# Patient Record
Sex: Female | Born: 1955
Health system: Southern US, Community
[De-identification: ages and names within clinical notes are randomized; demographics above are authoritative.]

## PROBLEM LIST (undated history)

## (undated) DIAGNOSIS — K2100 Gastro-esophageal reflux disease with esophagitis, without bleeding: Secondary | ICD-10-CM

## (undated) DIAGNOSIS — K449 Diaphragmatic hernia without obstruction or gangrene: Secondary | ICD-10-CM

## (undated) DIAGNOSIS — K635 Polyp of colon: Secondary | ICD-10-CM

## (undated) DIAGNOSIS — E119 Type 2 diabetes mellitus without complications: Secondary | ICD-10-CM

## (undated) HISTORY — DX: Gastro-esophageal reflux disease with esophagitis, without bleeding: K21.00

## (undated) HISTORY — PX: ABDOMINAL HYSTERECTOMY: SUR658

## (undated) HISTORY — DX: Polyp of colon: K63.5

## (undated) HISTORY — DX: Gastro-esophageal reflux disease with esophagitis: K21.0

## (undated) HISTORY — DX: Diaphragmatic hernia without obstruction or gangrene: K44.9

---

## 2001-09-02 ENCOUNTER — Ambulatory Visit (HOSPITAL_COMMUNITY): Admission: RE | Admit: 2001-09-02 | Discharge: 2001-09-02 | Payer: Self-pay | Admitting: Unknown Physician Specialty

## 2001-09-02 ENCOUNTER — Encounter: Payer: Self-pay | Admitting: Unknown Physician Specialty

## 2001-09-05 ENCOUNTER — Ambulatory Visit (HOSPITAL_COMMUNITY): Admission: RE | Admit: 2001-09-05 | Discharge: 2001-09-05 | Payer: Self-pay | Admitting: Unknown Physician Specialty

## 2001-09-05 ENCOUNTER — Encounter: Payer: Self-pay | Admitting: Unknown Physician Specialty

## 2002-09-04 ENCOUNTER — Ambulatory Visit (HOSPITAL_COMMUNITY): Admission: RE | Admit: 2002-09-04 | Discharge: 2002-09-04 | Payer: Self-pay | Admitting: Unknown Physician Specialty

## 2002-09-04 ENCOUNTER — Encounter: Payer: Self-pay | Admitting: Unknown Physician Specialty

## 2003-09-08 ENCOUNTER — Ambulatory Visit (HOSPITAL_COMMUNITY): Admission: RE | Admit: 2003-09-08 | Discharge: 2003-09-08 | Payer: Self-pay | Admitting: Unknown Physician Specialty

## 2004-09-09 ENCOUNTER — Ambulatory Visit (HOSPITAL_COMMUNITY): Admission: RE | Admit: 2004-09-09 | Discharge: 2004-09-09 | Payer: Self-pay | Admitting: *Deleted

## 2005-03-29 ENCOUNTER — Inpatient Hospital Stay (HOSPITAL_COMMUNITY): Admission: RE | Admit: 2005-03-29 | Discharge: 2005-04-01 | Payer: Self-pay | Admitting: Obstetrics & Gynecology

## 2005-03-29 ENCOUNTER — Encounter: Payer: Self-pay | Admitting: Obstetrics & Gynecology

## 2005-09-11 ENCOUNTER — Ambulatory Visit (HOSPITAL_COMMUNITY): Admission: RE | Admit: 2005-09-11 | Discharge: 2005-09-11 | Payer: Self-pay | Admitting: Unknown Physician Specialty

## 2006-09-13 ENCOUNTER — Ambulatory Visit (HOSPITAL_COMMUNITY): Admission: RE | Admit: 2006-09-13 | Discharge: 2006-09-13 | Payer: Self-pay | Admitting: Unknown Physician Specialty

## 2007-07-29 ENCOUNTER — Encounter: Payer: Self-pay | Admitting: Internal Medicine

## 2007-07-29 ENCOUNTER — Ambulatory Visit: Payer: Self-pay | Admitting: Internal Medicine

## 2007-07-29 ENCOUNTER — Ambulatory Visit (HOSPITAL_COMMUNITY): Admission: RE | Admit: 2007-07-29 | Discharge: 2007-07-29 | Payer: Self-pay | Admitting: Internal Medicine

## 2007-07-29 HISTORY — PX: COLONOSCOPY: SHX174

## 2007-09-05 ENCOUNTER — Encounter (HOSPITAL_COMMUNITY): Admission: RE | Admit: 2007-09-05 | Discharge: 2007-10-05 | Payer: Self-pay | Admitting: Podiatry

## 2007-09-16 ENCOUNTER — Ambulatory Visit (HOSPITAL_COMMUNITY): Admission: RE | Admit: 2007-09-16 | Discharge: 2007-09-16 | Payer: Self-pay | Admitting: Internal Medicine

## 2008-09-16 ENCOUNTER — Ambulatory Visit (HOSPITAL_COMMUNITY): Admission: RE | Admit: 2008-09-16 | Discharge: 2008-09-16 | Payer: Self-pay | Admitting: Internal Medicine

## 2009-02-10 ENCOUNTER — Ambulatory Visit: Admission: RE | Admit: 2009-02-10 | Discharge: 2009-02-10 | Payer: Self-pay | Admitting: Internal Medicine

## 2009-09-17 ENCOUNTER — Ambulatory Visit (HOSPITAL_COMMUNITY): Admission: RE | Admit: 2009-09-17 | Discharge: 2009-09-17 | Payer: Self-pay | Admitting: Internal Medicine

## 2010-09-02 ENCOUNTER — Other Ambulatory Visit (HOSPITAL_COMMUNITY): Payer: Self-pay | Admitting: Internal Medicine

## 2010-09-02 DIAGNOSIS — Z1239 Encounter for other screening for malignant neoplasm of breast: Secondary | ICD-10-CM

## 2010-09-20 ENCOUNTER — Ambulatory Visit (HOSPITAL_COMMUNITY): Admission: RE | Admit: 2010-09-20 | Payer: Self-pay | Source: Home / Self Care | Admitting: Internal Medicine

## 2010-09-20 ENCOUNTER — Ambulatory Visit (HOSPITAL_COMMUNITY)
Admission: RE | Admit: 2010-09-20 | Discharge: 2010-09-20 | Disposition: A | Discharge status: Home / Self Care | Payer: 59 | Source: Ambulatory Visit | Attending: Internal Medicine | Admitting: Internal Medicine

## 2010-09-20 DIAGNOSIS — Z1231 Encounter for screening mammogram for malignant neoplasm of breast: Secondary | ICD-10-CM | POA: Insufficient documentation

## 2010-09-20 DIAGNOSIS — Z1239 Encounter for other screening for malignant neoplasm of breast: Secondary | ICD-10-CM

## 2010-11-16 ENCOUNTER — Other Ambulatory Visit (HOSPITAL_COMMUNITY): Payer: Self-pay | Admitting: Internal Medicine

## 2010-11-16 DIAGNOSIS — R1011 Right upper quadrant pain: Secondary | ICD-10-CM

## 2010-11-17 ENCOUNTER — Ambulatory Visit (HOSPITAL_COMMUNITY)
Admission: RE | Admit: 2010-11-17 | Discharge: 2010-11-17 | Disposition: A | Discharge status: Home / Self Care | Payer: 59 | Source: Ambulatory Visit | Attending: Internal Medicine | Admitting: Internal Medicine

## 2010-11-17 DIAGNOSIS — R1011 Right upper quadrant pain: Secondary | ICD-10-CM | POA: Insufficient documentation

## 2010-11-17 DIAGNOSIS — R110 Nausea: Secondary | ICD-10-CM | POA: Insufficient documentation

## 2010-12-27 NOTE — Op Note (Signed)
NAME:  Cheryl Gutierrez, Cheryl Gutierrez             ACCOUNT NO.:  [id number]   MEDICAL RECORD NO.:  [id number]          PATIENT TYPE:  AMB   LOCATION:  DAY                           FACILITY:  [facility]   PHYSICIAN:  R. Roetta Sessions, M.D. DATE OF BIRTH:  [birth year]   DATE OF PROCEDURE:  07/29/2007  DATE OF DISCHARGE:                               OPERATIVE REPORT   PROCEDURE:  Colonoscopy with snare polypectomy.   INDICATIONS FOR PROCEDURE:  Patient is a 55 year old lady sent over for  consideration for colorectal cancer screening.  She has never had her  lower GI tract imaged.  She is devoid of any lower GI tract symptoms.  There is no family history of colon cancer.  Colonoscopy is now being  done.  This approach has been discussed with the patient at length.  Potential risks, benefits and alternatives have been reviewed and  questions answered.  She is agreeable.  Please see documentation on the  medical record.   PROCEDURE NOTE:  O2 saturation, blood pressure, pulses, and respirations  were monitored throughout the entire procedure.  Conscious sedation with  Versed 5 mg IV and Demerol 100 mg IV in divided doses.   INSTRUMENT:  Pentax video chip system.   FINDINGS:  Digital rectal exam revealed no abnormalities.   ENDOSCOPIC FINDINGS:  Prep was adequate.  The colonic mucosa was  surveyed from the rectosigmoid junction through the left transverse,  right colon, to the appendiceal orifice, the ileocecal valve, and cecum.  These structures were well seen and photographed for the record.  From  this level, the scope was slowly and cautiously withdrawn, and all  previously mentioned mucosal surfaces were again seen.  In the mid  sigmoid colon, there was a 5 mm pedunculated polyp.  It was cold snared  and recovered through the scope.  The remainder of the colonic mucosa  appeared normal.  The scoped was pulled down to the rectum.  A thorough  examination of the rectal mucosa, including retroflexed view  of the anal  verge, demonstrated some anal papilla, otherwise rectal mucosa appeared  normal.  The patient tolerated the procedure well and was reactive.   ENDOSCOPY IMPRESSION:  1. Anal papillae, otherwise normal rectum.  2. Pedunculated polyp, mid sigmoid colon, removed with snare, as      described above.  The remainder of colonic mucosa appeared normal.   RECOMMENDATIONS:  1. Follow up on path.  2. Further recommendations to follow.      Jonathon Bellows, M.D.  Electronically Signed     RMR/MEDQ  D:  07/29/2007  T:  07/29/2007  Job:  [id number]   cc:   Kingsley Callander. Ouida Sills, MD  Fax: [phone]

## 2010-12-27 NOTE — Procedures (Signed)
NAME:  Cheryl Gutierrez, Cheryl Gutierrez             ACCOUNT NO.:  [id number]   MEDICAL RECORD NO.:  [id number]          PATIENT TYPE:  OUT   LOCATION:  SLEE                          FACILITY:  [facility]   PHYSICIAN:  Kofi A. Gerilyn Pilgrim, M.D. DATE OF BIRTH:  1956-03-09   DATE OF PROCEDURE:  02/10/2009  DATE OF DISCHARGE:  02/10/2009                             SLEEP DISORDER REPORT   REFERRING PHYSICIAN:  Kingsley Callander. Ouida Sills, MD   INDICATIONS:  A 53-year lady who presents with hypersomnia, snoring, and  is being evaluated for obstructive sleep apnea syndrome.   MEDICATIONS:  Simvastatin, Paxil, aspirin.   EPWORTH SLEEPINESS SCALE:  1. BMI 32.   ARCHITECTURAL SUMMARY:  This is a split night recording with the initial  part being a diagnostic and the second part a titration recording.  The  total recording time is 441 minutes.  The sleep efficiency is 73.5%.  Sleep latency 85 minutes.  REM latency 244.5 minutes.   RESPIRATORY SUMMARY:  The baseline oxygen saturation is 98 with the  lowest saturation 91.  The diagnostic AHI is 23.6.  The patient was  titrated between pressures of 5 and 8; however, the optimal pressure is  7 resulting in resolution of events and good tolerability.   LIMB MOVEMENTS SUMMARY:  The PLM index is 14.   ELECTROCARDIOGRAM SUMMARY:  Average heart rate of 77 with no arrhythmias  observed.   IMPRESSION:  1. Moderate obstructive sleep apnea syndrome which responded well to a      continuous positive airway pressure of 7.  2. 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      Kofi A. Gerilyn Pilgrim, M.D.  Electronically Signed     KAD/MEDQ  D:  02/16/2009  T:  02/16/2009  Job:  [id number]

## 2010-12-30 NOTE — Op Note (Signed)
NAME:  Cheryl Gutierrez, Cheryl Gutierrez NO.:  [id number]   MEDICAL RECORD NO.:  [id number]          PATIENT TYPE:  AMB   LOCATION:  DAY                           FACILITY:  [facility]   PHYSICIAN:  Lazaro Arms, M.D.   DATE OF BIRTH:  [birth year]   DATE OF PROCEDURE:  03/29/2005  DATE OF DISCHARGE:                                 OPERATIVE REPORT   PREOPERATIVE DIAGNOSES:  1.  Enlarged fibroid uterus.  2.  Menometrorrhagia.   POSTOPERATIVE DIAGNOSES:  1.  Enlarged fibroid uterus.  2.  Menometrorrhagia.   OPERATION PERFORMED:  Total abdominal hysterectomy, bilateral salpingo-  oophorectomy.   SURGEON:  Lazaro Arms, M.D.   ANESTHESIA:  General endotracheal.   FINDINGS:  The patient had multiple fibroids in her uterus, probably 8 to 10  week size.  She also had a left paratubal cyst that was benign.  Her ovaries  were normal.  Appendix was normal.  The rest of the intra-abdominal  peritoneal cavity was normal.   DESCRIPTION OF PROCEDURE:  The patient was taken to the operating room and  placed in supine position where she underwent general endotracheal  anesthesia. A Foley catheter was placed.  The vagina was prepped and the  abdomen was prepped and draped in the usual sterile fashion.  A Pfannenstiel  skin incision was made and carried down sharply to the rectus fascia which  was scored in the midline and extended laterally.  The fascia was taken off  the muscles superiorly and inferiorly without difficulty.  The muscles were  divided.  The peritoneal cavity was entered.  Balfour self-retaining  retractor was placed and the upper abdomen was packed away.  The uterine  cornua were grasped.  The left round ligament was suture ligated and cut.  The infundibulopelvic ligament was isolated, clamped, cut and double suture  ligated.  The vesicouterine serosal flap on the left was created.  The right  round ligament was suture ligated and cut.  The infundibulopelvic ligament  on  the right was clamped, cut and doubly suture ligated.  The vesicouterine  serosa was clamped on the right, was created and the uterine vessels  skeletonized bilaterally.  The uterine vessels were clamped, cut and suture  ligated.  Serial pedicles were taken down the cervix through the cardinal  ligament, each pedicle being clamped, cut and suture ligated.  Vaginal angle  was encountered and sutured.  The specimen was removed and the vagina was  closed with 0 Vicryl suture.  The pelvis was irrigated vigorously.  All  pedicles were found to be hemostatic.  Interceed was placed over the vaginal  cuff to prevent postoperative adhesion formation.  All laps were removed and  the Balfour self-retaining retractor was removed.  The muscle was  reapproximated loosely, the fascia closed using 0 Vicryl running,  subcutaneous tissue was made  hemostatic and irrigated. The skin was closed using skin staples.  The  patient tolerated the procedure well.  She experienced 125 mL blood loss and  was taken to the recovery room in good stable condition, all counts correct  times  three.      Lazaro Arms, M.D.  Electronically Signed     LHE/MEDQ  D:  03/29/2005  T:  03/29/2005  Job:  [id number]

## 2010-12-30 NOTE — Discharge Summary (Signed)
NAME:  Cheryl Gutierrez, Cheryl Gutierrez                       ACCOUNT NO.:  [id number]   MEDICAL RECORD NO.:  [id number]                   PATIENT TYPE:  OUT   LOCATION:  RAD                                  FACILITY:  [facility]   PHYSICIAN:  Dirk Dress. Katrinka Blazing, M.D.                DATE OF BIRTH:  12-31-1955   DATE OF ADMISSION:  12/26/2003  DATE OF DISCHARGE:  01/02/2004                                 DISCHARGE SUMMARY   DISCHARGE DIAGNOSES:  1. Schizophrenia with acute psychotic episode.  2. Diabetes mellitus.  3. Hypertension.   DISPOSITION:  The patient is discharged home in stable and satisfactory  condition.   DISCHARGE MEDICATIONS:  1. __________ carbonate 300 mg two tablets q.h.s.  2. Zyprexa 30 mg q.h.s.  3. Lotrel 520 one daily.  4. Thiazide 37.5/25 daily.  5. Ultram 50 mg q.i.d.  6. Lortab 7.5 mg q.6h. p.r.n.  7. Atacan 32 mg daily.  8. Xanax 1 mg t.i.d.  9. [facility]   FOLLOWUP:  She is scheduled to be seen in the office Jan 04, 2004.   SUMMARY:  A 56 year old female admitted with acute and psychotic episode.  The patient has a long history of schizophrenia but has been relatively  stable for the last 10 years.  She has not had any acute exacerbations and  had not required hospitalizations.  The patient's states that she became  severely agitated on the day of admission.  She complained of hurting all  over, shortness of breath, she started shaking as if she was having  seizures.  She was transported to the emergency room by EMS.  In the  emergency room, she was found to have generalized shaking with tachypnea and  stiff neck.  Blood pressure was elevated, and she had difficulty talking.  She was treated with multiple doses of IV Ativan and gradually improved.  When I evaluated the patient, she was much improved and somnolent.  She no  longer had tachypnea.  It was felt that she needed to be admitted and have  behavior health evaluation.  On exam,  she was cooperative.  She had excess  makeup.  Blood pressure 170/100, pulse 130, respirations 28, temperature 98,  oxygen saturation 97%.  General physical examination was otherwise normal.  Urine drug screen was positive only for benzodiazepines.  Blood gas pH 7.56,  PCO2 28, PO2 79, bicarbonate 25.  White count 6.3, hemoglobin 13.9,  hematocrit 29.6.  Urinalysis was normal.  Chest x-ray as normal.  Metabolic  panel was normal.  The patient was admitted.  She was given IV Ativan p.r.n.  __________ consult was obtained, and it was the impression that she was  stable.  The patient's behavior was somewhat erratic, but she had periods of  increased anxiety.  EEG was done, and this ruled out seizure disorder.  She  had some mild shortness of breath with  some chronic wheezes.  She was  treated with steroids, and this resolved.  She also received Xopenex and  Atrovent  inhalers.  By May 21, she was stable.  She was not anxious.  She still had  occasions of hearing voices.  Vital signs were stable.  Lungs only revealed  a few rhonchi.  She was discharged home with plans to follow up by mental  health on May 22, to get IM Haldol.     ___________________________________________                                         Dirk Dress. Katrinka Blazing, M.D.   LCS/MEDQ  D:  02/07/2004  T:  02/08/2004  Job:  [id number]

## 2010-12-30 NOTE — Discharge Summary (Signed)
NAME:  JEFFRIESCordelia, Bessinger NO.:  [id number]   MEDICAL RECORD NO.:  [id number]          PATIENT TYPE:  INP   LOCATION:  A312                          FACILITY:  [facility]   PHYSICIAN:  Lazaro Arms, M.D.   DATE OF BIRTH:  [birth year]   DATE OF ADMISSION:  03/29/2005  DATE OF DISCHARGE:  08/19/2006LH                                 DISCHARGE SUMMARY   DISCHARGE DIAGNOSES:  1.  Status post total abdominal hysterectomy bilateral salpingo-      oophorectomy.  2.  Unremarkable postoperative course.   PROCEDURES:  TAH BSO.   Please refer to the transcribed History and Physical as well as the OP  report for details of admission to the hospital.   HOSPITAL COURSE:  The patient was admitted after surgery.  Her  intraoperative course was unremarkable.  She tolerated clear liquids and a  regular diet.  She voided without symptoms and was extensively ambulatory.  Her postop day #1, hemoglobin and hematocrit were 13 and 37.0, postop day #3  was 12.9 and 36.7.  This was basically stable with preoperative hemoglobin  of 14.4 and 41.4.  Her incision was clean dry and intact.  Her abdominal  exam was benign.  She began to have normal bowel function.  She was  discharged to home on the morning of postop day #3 in good stable condition.  Follow up in the office next week and have her staples removed.  She was  discharged to home on Motrin and Tylox.      Lazaro Arms, M.D.  Electronically Signed     LHE/MEDQ  D:  05/17/2005  T:  05/17/2005  Job:  [id number]

## 2010-12-30 NOTE — H&P (Signed)
NAME:  JEFFRIESDeryn, Mass[place]ale NO.:  [id number]   MEDICAL RECORD NO.:  [id number]          PATIENT TYPE:  AMB   LOCATION:  DAY                           FACILITY:  [facility]   PHYSICIAN:  Lazaro Arms, M.D.   DATE OF BIRTH:  December 08, 1955   DATE OF ADMISSION:  DATE OF DISCHARGE:  LH                                HISTORY & PHYSICAL   Cheryl Gutierrez is a 55 year old white female, gravida 0, para 0, last menstrual  period which began April 14 and merely has basically continued.  She has  suffered with prolonged vaginal bleeding, dysfunctional uterine bleeding now  for quite some time.   On examination, she has an enlarged uterus of 8 to 10-week size consistent  with fibroids, and as a result of that, we considered ablation, but she is  opting for definitive therapy with hysterectomy.  Of course with her age,  will also remove the ovaries.  Ultrasound confirms the presence of multiple  fibroids, and we will proceed with TAH-BSO.   PAST MEDICAL HISTORY:  Negative.   PAST SURGICAL HISTORY:  Negative.   PAST OBSTETRICAL HISTORY:  Nulliparous.   ALLERGIES:  None.   MEDICATIONS:  Paxil.   PHYSICAL EXAMINATION:  VITAL SIGNS:  Blood pressure 140/80, weight 191  pounds.  HEENT:  Unremarkable.  NECK: Thyroid is normal.  LUNGS:  Clear.  HEART: Regular rhythm without regurgitation or gallop.  BREASTS:  Without mass, discharge, skin changes.  ABDOMEN:  Benign.  No hepatosplenomegaly or masses.  PELVIC:  She has normal external genitalia.  Vagina moist without discharge.  Uterus enlarged at 8 to 10-week size.  Multiple fibroids.  Ovaries normal,  nontender.  EXTREMITIES:  Warm without edema.  NEUROLOGIC:  Exam grossly intact.   IMPRESSION:  1.  Enlarged fibroid uterus.  2.  Menometrorrhagia, dysfunctional bleeding.   PLAN:  The patient admitted for TAH-BSO.  She understands the risks,  benefits, indications, alternatives, and will proceed.      Lazaro Arms, M.D.  Electronically Signed     LHE/MEDQ  D:  03/28/2005  T:  03/28/2005  Job:  [id number]

## 2011-07-31 ENCOUNTER — Encounter: Payer: Self-pay | Admitting: Orthopedic Surgery

## 2011-07-31 ENCOUNTER — Ambulatory Visit (INDEPENDENT_AMBULATORY_CARE_PROVIDER_SITE_OTHER): Payer: 59 | Admitting: Orthopedic Surgery

## 2011-07-31 ENCOUNTER — Ambulatory Visit: Payer: 59 | Admitting: Orthopedic Surgery

## 2011-07-31 VITALS — BP 108/68 | Ht 64.0 in | Wt 176.0 lb

## 2011-07-31 DIAGNOSIS — M719 Bursopathy, unspecified: Secondary | ICD-10-CM

## 2011-07-31 DIAGNOSIS — M75102 Unspecified rotator cuff tear or rupture of left shoulder, not specified as traumatic: Secondary | ICD-10-CM | POA: Insufficient documentation

## 2011-07-31 MED ORDER — NABUMETONE 500 MG PO TABS: 500.0000 mg | ORAL_TABLET | Freq: Two times a day (BID) | ORAL | Status: AC

## 2011-07-31 NOTE — Progress Notes (Signed)
Patient ID: Cheryl Gutierrez, female   DOB: Oct 24, 1955, 55 y.o.   MRN: [id number] X-ray report  2 views LEFT shoulder  Normal glenohumeral joint, normal humerus, normal acromion.  Impression normal shoulder Subacromial Shoulder Injection Procedure Note  Pre-operative Diagnosis: left RC Syndrome  Post-operative Diagnosis: same  Indications: pain   Anesthesia: ethyl chloride   Procedure Details   Verbal consent was obtained for the procedure. The shoulder was prepped withalcohol and the skin was anesthetized. A 20 gauge needle was advanced into the subacromial space through posterior approach without difficulty  The space was then injected with 3 ml 1% lidocaine and 1 ml of depomedrol. The injection site was cleansed with isopropyl alcohol and a dressing was applied.  Complications:  None; patient tolerated the procedure well.  Subjective:    Cheryl Gutierrez is a 55 y.o. female who presents with left shoulder pain. The symptoms began several months ago. Aggravating factors: raising the arm and rolling onto the arm. Pain is located deltoid area and upper arm . Discomfort is described as sharp/stabbing. Symptoms are exacerbated by overhead movements and lying on the shoulder. Evaluation to date: none. Therapy to date includes: none.  The following portions of the patient's history were reviewed and updated as appropriate: allergies, current medications, past family history, past medical history, past social history, past surgical history and problem list.  Review of Systems A comprehensive review of systems was negative except for: Neurological: positive for numbness medial side right great toe   Objective:    BP 108/68  Ht 5\' 4"  (1.626 m)  Wt 176 lb (79.833 kg)  BMI 30.21 kg/m2 Right shoulder: normal active ROM, no tenderness, no impingement sign  Left shoulder: tenderness over the glenohumeral joint, distal neuromuscular exam negative, positive for tenderness about the  glenohumeral joint, negative for tenderness over the acromioclavicular joint, positive for impingement sign, sensory exam normal, motor exam normal and radial pulse intact     Assessment:    Left rotator cuff syndrome    Plan:    Educational material distributed. Reduction in offending activity. Gentle ROM exercises. NSAIDs per medication orders. Plain film x-rays. Shoulder injection. See procedure note. HEP [employer]ERABANDS

## 2011-07-31 NOTE — Patient Instructions (Addendum)
You have received a steroid shot. 15% of patients experience increased pain at the injection site with in the next 24 hours. This is best treated with ice and tylenol extra strength 2 tabs every 8 hours. If you are still having pain please call the office.   Start exercise program  [address]

## 2011-08-03 ENCOUNTER — Ambulatory Visit: Payer: 59 | Admitting: Orthopedic Surgery

## 2011-09-01 ENCOUNTER — Other Ambulatory Visit (HOSPITAL_COMMUNITY): Payer: Self-pay | Admitting: Internal Medicine

## 2011-09-01 DIAGNOSIS — Z139 Encounter for screening, unspecified: Secondary | ICD-10-CM

## 2011-09-25 ENCOUNTER — Ambulatory Visit (HOSPITAL_COMMUNITY)
Admission: RE | Admit: 2011-09-25 | Discharge: 2011-09-25 | Disposition: A | Discharge status: Home / Self Care | Payer: 59 | Source: Ambulatory Visit | Attending: Internal Medicine | Admitting: Internal Medicine

## 2011-09-25 DIAGNOSIS — Z139 Encounter for screening, unspecified: Secondary | ICD-10-CM

## 2011-09-25 DIAGNOSIS — Z1231 Encounter for screening mammogram for malignant neoplasm of breast: Secondary | ICD-10-CM | POA: Insufficient documentation

## 2012-09-09 ENCOUNTER — Other Ambulatory Visit (HOSPITAL_COMMUNITY): Payer: Self-pay | Admitting: Internal Medicine

## 2012-09-09 DIAGNOSIS — Z139 Encounter for screening, unspecified: Secondary | ICD-10-CM

## 2012-09-30 ENCOUNTER — Ambulatory Visit (HOSPITAL_COMMUNITY): Payer: 59

## 2012-09-30 ENCOUNTER — Ambulatory Visit (HOSPITAL_COMMUNITY)
Admission: RE | Admit: 2012-09-30 | Discharge: 2012-09-30 | Disposition: A | Discharge status: Home / Self Care | Payer: 59 | Source: Ambulatory Visit | Attending: Internal Medicine | Admitting: Internal Medicine

## 2012-09-30 DIAGNOSIS — Z139 Encounter for screening, unspecified: Secondary | ICD-10-CM

## 2012-09-30 DIAGNOSIS — Z1231 Encounter for screening mammogram for malignant neoplasm of breast: Secondary | ICD-10-CM | POA: Insufficient documentation

## 2013-02-04 ENCOUNTER — Other Ambulatory Visit (HOSPITAL_COMMUNITY): Payer: Self-pay | Admitting: Internal Medicine

## 2013-02-04 DIAGNOSIS — R1011 Right upper quadrant pain: Secondary | ICD-10-CM

## 2013-02-05 ENCOUNTER — Encounter (HOSPITAL_COMMUNITY): Payer: Self-pay

## 2013-02-05 ENCOUNTER — Ambulatory Visit (HOSPITAL_COMMUNITY)
Admission: RE | Admit: 2013-02-05 | Discharge: 2013-02-05 | Disposition: A | Discharge status: Home / Self Care | Payer: 59 | Source: Ambulatory Visit | Attending: Internal Medicine | Admitting: Internal Medicine

## 2013-02-05 DIAGNOSIS — R1011 Right upper quadrant pain: Secondary | ICD-10-CM

## 2013-02-05 HISTORY — DX: Type 2 diabetes mellitus without complications: E11.9

## 2013-02-05 MED ORDER — TECHNETIUM TC 99M MEBROFENIN IV KIT
5.0000 | PACK | Freq: Once | INTRAVENOUS | Status: AC | PRN
  Administered 2013-02-05: 5 via INTRAVENOUS

## 2013-03-06 ENCOUNTER — Telehealth: Payer: Self-pay | Admitting: Pulmonary Disease

## 2013-03-06 NOTE — Telephone Encounter (Signed)
I spoke with pt. She has never been seen in our office. She stated Dr Ouida Sills advised her since [facility] read her study he will need to be the one to increase her CPAP pressure if needed. I advised her whoever has been signing for her supplies is the one who needs to address this. I advised her since we have never seen her we can not do so. I advised her if she is needing to be seen/establish a sleep physician we can set up appt. She stated she will call Dr. Alonza Smoker office again to see if they will. If not she will call for appt. Nothing further was needed

## 2013-03-24 ENCOUNTER — Telehealth: Payer: Self-pay | Admitting: Internal Medicine

## 2013-03-24 NOTE — Telephone Encounter (Signed)
Pt is scheduled to see Dr. Jena Gauss on tomorrow at 11:30am

## 2013-03-24 NOTE — Telephone Encounter (Signed)
Patient called to make OV with RMR. She has been having abdominal pain and is concerned about it. I told her RMR's next available with be 9/17. She doesn't want to wait that far out. I offered to make her OV with an extender, but she said, "No, she wanted RMR". She said to wait and she would see what she could do.

## 2013-03-25 ENCOUNTER — Ambulatory Visit (INDEPENDENT_AMBULATORY_CARE_PROVIDER_SITE_OTHER): Payer: 59 | Admitting: Internal Medicine

## 2013-03-25 ENCOUNTER — Encounter: Payer: Self-pay | Admitting: Internal Medicine

## 2013-03-25 VITALS — BP 134/69 | HR 76 | Temp 98.4°F | Ht 64.0 in | Wt 188.2 lb

## 2013-03-25 DIAGNOSIS — R109 Unspecified abdominal pain: Secondary | ICD-10-CM | POA: Insufficient documentation

## 2013-03-25 LAB — CBC WITH DIFFERENTIAL/PLATELET
Basophils Absolute: 0 10*3/uL (ref 0.0–0.1)
Basophils Relative: 0 % (ref 0–1)
Eosinophils Absolute: 0.1 10*3/uL (ref 0.0–0.7)
Eosinophils Relative: 2 % (ref 0–5)
HCT: 42.1 % (ref 36.0–46.0)
MCHC: 34.2 g/dL (ref 30.0–36.0)
MCV: 84.5 fL (ref 78.0–100.0)
Monocytes Relative: 6 % (ref 3–12)
Neutro Abs: 4.3 10*3/uL (ref 1.7–7.7)
Platelets: 332 10*3/uL (ref 150–400)
RBC: 4.98 MIL/uL (ref 3.87–5.11)

## 2013-03-25 LAB — HEPATIC FUNCTION PANEL
AST: 27 U/L (ref 0–37)
Total Bilirubin: 0.6 mg/dL (ref 0.3–1.2)

## 2013-03-25 LAB — LIPASE: Lipase: 33 U/L (ref 0–75)

## 2013-03-25 NOTE — Patient Instructions (Addendum)
Schedule diagnostic EGD  Serum lipase, lfts, cbc today  Further recommendations to follow

## 2013-03-25 NOTE — Progress Notes (Signed)
Primary Care Physician:  FAGAN,ROY, MD Primary Gastroenterologist:  Dr. Bettylou Frew  Pre-Procedure History & Physical: HPI:  Cheryl Gutierrez is a 57 y.o. female here for for further evaluation of a 3-4 month history of insidiously worsening abdominal pain.  Patient describes pain as epigastric, sometimes bandlike upper abdominal discomfort may last for hours, sometimes overnight, and just this past weekend lasted for about 3 days before gradually subsiding. Intermittent early satiety. Nausea but no vomiting. Not necessarily related to eating or having a bowel movement. No fever or chills. She denies any typical reflux symptoms and no dysphagia. Ultrasound and HIDA recently demonstrated a fatty appearing pancreas; no evidence of occult gallstone disease.  Gallbladder appeared normal.  Lliver appeared normal. Gallbladder EF 93% on a HIDA without any symptoms. Has not tried any empiric treatment. No new medications recently. No change in weight. No family history of any GI neoplasia. She had a colonoscopy by me for screening purposes in 2008 which revealed a single polyp removed which turned out to be hyperplastic.  No NSAIDs or alcohol.  Past Medical History  Diagnosis Date  . Diabetes mellitus without complication   . Hyperplastic colon polyp     Past Surgical History  Procedure Laterality Date  . Abdominal hysterectomy    . Colonoscopy  07/29/2007    Dr. Deborra Phegley- anal papillae, o/w normal rectum, pedunculated polyp,mic sigmoid colon, hyperplastic polyp on bx, the remainder of the colonic mucosa appeared normal    Prior to Admission medications   Medication Sig Start Date End Date Taking? Authorizing Provider  aspirin 81 MG tablet Take 81 mg by mouth daily.     Yes Historical Provider, MD  PARoxetine (PAXIL) 40 MG tablet Take 40 mg by mouth every morning.     Yes Historical Provider, MD    Allergies as of 03/25/2013  . (No Known Allergies)    Family History  Problem Relation Age of Onset  .  Anesthesia problems Neg Hx   . Broken bones Neg Hx   . Cancer Neg Hx   . Clotting disorder Neg Hx   . Collagen disease Neg Hx   . Diabetes Neg Hx   . Dislocations Neg Hx   . Osteoporosis Neg Hx   . Rheumatologic disease Neg Hx   . Scoliosis Neg Hx   . Severe sprains Neg Hx     History   Social History  . Marital Status: Married    Spouse Name: N/A    Number of Children: N/A  . Years of Education: college   Occupational History  . clerical    Social History Main Topics  . Smoking status: Never Smoker   . Smokeless tobacco: Not on file  . Alcohol Use: No  . Drug Use: No  . Sexually Active: Not on file   Other Topics Concern  . Not on file   Social History Narrative  . No narrative on file    Review of Systems: See HPI, otherwise negative ROS  Physical Exam: BP 134/69  Pulse 76  Temp(Src) 98.4 F (36.9 C) (Oral)  Ht 5' 4" (1.626 m)  Wt 188 lb 3.2 oz (85.367 kg)  BMI 32.29 kg/m2 General:   Alert,  Well-developed, well-nourished, pleasant and cooperative in NAD Skin:  Intact without significant lesions or rashes. Eyes:  Sclera clear, no icterus.   Conjunctiva pink. Ears:  Normal auditory acuity. Nose:  No deformity, discharge,  or lesions. Mouth:  No deformity or lesions. Neck:  Supple; no masses or   thyromegaly. No significant cervical adenopathy. Lungs:  Clear throughout to auscultation.   No wheezes, crackles, or rhonchi. No acute distress. Heart:  Regular rate and rhythm; no murmurs, clicks, rubs,  or gallops. Abdomen: Somewhat obese. Positive bowel sounds she does have epigastric tenderness to palpation. No appreciable mass or hepatosplenomegaly. Pulses:  Normal pulses noted. Extremities:  Without clubbing or edema.  Impression:  Pleasant 57-year-old lady with insidiously progressive intermittent epigastric/upper abdominal pain.  Notes early satiety. Symptoms may last as long as 3 days. Symptoms would be atypical for biliary tract disease and even acid  peptic disease for that matter. She does have some degree of early satiety. There is really not a postprandial component making mesenteric ischemia less likely. Mild pancreatitis would remain in the differential.  Recommendations:  Diagnostic EGD in the near future.The risks, benefits, limitations, alternatives and imponderables have been reviewed with the patient. Potential for esophageal dilation, biopsy, etc. have also been reviewed.  Questions have been answered. All parties agreeable.  CBC, serum lipase and hepatic function profile today  Further recommendations to follow. 

## 2013-03-27 ENCOUNTER — Encounter (HOSPITAL_COMMUNITY): Payer: Self-pay | Admitting: Pharmacy Technician

## 2013-03-29 ENCOUNTER — Encounter: Payer: Self-pay | Admitting: Internal Medicine

## 2013-03-31 ENCOUNTER — Encounter (HOSPITAL_COMMUNITY)
Admission: RE | Disposition: A | Discharge status: Home / Self Care | Payer: Self-pay | Source: Ambulatory Visit | Attending: Internal Medicine

## 2013-03-31 ENCOUNTER — Encounter (HOSPITAL_COMMUNITY): Payer: Self-pay | Admitting: *Deleted

## 2013-03-31 ENCOUNTER — Ambulatory Visit (HOSPITAL_COMMUNITY)
Admission: RE | Admit: 2013-03-31 | Discharge: 2013-03-31 | Disposition: A | Discharge status: Home / Self Care | Payer: 59 | Source: Ambulatory Visit | Attending: Internal Medicine | Admitting: Internal Medicine

## 2013-03-31 DIAGNOSIS — E119 Type 2 diabetes mellitus without complications: Secondary | ICD-10-CM | POA: Insufficient documentation

## 2013-03-31 DIAGNOSIS — Z79899 Other long term (current) drug therapy: Secondary | ICD-10-CM | POA: Insufficient documentation

## 2013-03-31 DIAGNOSIS — D131 Benign neoplasm of stomach: Secondary | ICD-10-CM

## 2013-03-31 DIAGNOSIS — K449 Diaphragmatic hernia without obstruction or gangrene: Secondary | ICD-10-CM | POA: Insufficient documentation

## 2013-03-31 DIAGNOSIS — Z90710 Acquired absence of both cervix and uterus: Secondary | ICD-10-CM | POA: Insufficient documentation

## 2013-03-31 DIAGNOSIS — R109 Unspecified abdominal pain: Secondary | ICD-10-CM

## 2013-03-31 DIAGNOSIS — Z7982 Long term (current) use of aspirin: Secondary | ICD-10-CM | POA: Insufficient documentation

## 2013-03-31 DIAGNOSIS — K2100 Gastro-esophageal reflux disease with esophagitis, without bleeding: Secondary | ICD-10-CM | POA: Insufficient documentation

## 2013-03-31 DIAGNOSIS — Z860100 Personal history of colon polyps, unspecified: Secondary | ICD-10-CM | POA: Insufficient documentation

## 2013-03-31 DIAGNOSIS — K2940 Chronic atrophic gastritis without bleeding: Secondary | ICD-10-CM

## 2013-03-31 DIAGNOSIS — R7402 Elevation of levels of lactic acid dehydrogenase (LDH): Secondary | ICD-10-CM | POA: Insufficient documentation

## 2013-03-31 DIAGNOSIS — K2289 Other specified disease of esophagus: Secondary | ICD-10-CM | POA: Insufficient documentation

## 2013-03-31 DIAGNOSIS — R7401 Elevation of levels of liver transaminase levels: Secondary | ICD-10-CM | POA: Insufficient documentation

## 2013-03-31 DIAGNOSIS — K228 Other specified diseases of esophagus: Secondary | ICD-10-CM | POA: Insufficient documentation

## 2013-03-31 HISTORY — PX: ESOPHAGOGASTRODUODENOSCOPY: SHX5428

## 2013-03-31 SURGERY — EGD (ESOPHAGOGASTRODUODENOSCOPY)
Anesthesia: Moderate Sedation

## 2013-03-31 MED ORDER — MEPERIDINE HCL 100 MG/ML IJ SOLN
INTRAMUSCULAR | Status: DC | PRN
  Administered 2013-03-31: 25 mg via INTRAVENOUS
  Administered 2013-03-31: 50 mg via INTRAVENOUS

## 2013-03-31 MED ORDER — BUTAMBEN-TETRACAINE-BENZOCAINE 2-2-14 % EX AERO
INHALATION_SPRAY | CUTANEOUS | Status: DC | PRN
  Administered 2013-03-31: 2 via TOPICAL

## 2013-03-31 MED ORDER — MIDAZOLAM HCL 5 MG/5ML IJ SOLN
INTRAMUSCULAR | Status: DC | PRN
  Administered 2013-03-31 (×2): 2 mg via INTRAVENOUS

## 2013-03-31 MED ORDER — STERILE WATER FOR IRRIGATION IR SOLN
Status: DC | PRN
  Administered 2013-03-31: 11:00:00

## 2013-03-31 MED ORDER — MEPERIDINE HCL 100 MG/ML IJ SOLN
INTRAMUSCULAR | Status: AC
  Filled 2013-03-31: qty 2

## 2013-03-31 MED ORDER — ONDANSETRON HCL 4 MG/2ML IJ SOLN
INTRAMUSCULAR | Status: AC
  Filled 2013-03-31: qty 2

## 2013-03-31 MED ORDER — SODIUM CHLORIDE 0.9 % IV SOLN
INTRAVENOUS | Status: DC
  Administered 2013-03-31: 11:00:00 via INTRAVENOUS

## 2013-03-31 MED ORDER — MIDAZOLAM HCL 5 MG/5ML IJ SOLN
INTRAMUSCULAR | Status: AC
  Filled 2013-03-31: qty 10

## 2013-03-31 MED ORDER — ONDANSETRON HCL 4 MG/2ML IJ SOLN
INTRAMUSCULAR | Status: DC | PRN
  Administered 2013-03-31: 4 mg via INTRAVENOUS

## 2013-03-31 NOTE — Op Note (Signed)
[facility] [address] [place], [address]   ENDOSCOPY PROCEDURE REPORT  PATIENT: Cheryl, Gutierrez  MR#: [id number] BIRTHDATE: [birth year] , 57  yrs. old GENDER: Female ENDOSCOPIST: R.  Roetta Sessions, MD FACP FACG REFERRED BY:  Carylon Perches, M.D. PROCEDURE DATE:  03/31/2013 PROCEDURE:     EGD with gastric biopsy  INDICATIONS:     upper abdominal pain. Recent CBC and lipase normal. LFTs minimally elevated ALT of 38  INFORMED CONSENT:   The risks, benefits, limitations, alternatives and imponderables have been discussed.  The potential for biopsy, esophogeal dilation, etc. have also been reviewed.  Questions have been answered.  All parties agreeable.  Please see the history and physical in the medical record for more information.  MEDICATIONS:    Versed 4 mg IV and Demerol 75 mg IV in divided doses. Zofran 4 mg IV. Cetacaine spray.  DESCRIPTION OF PROCEDURE:   The EG-[id number] ([id number])  endoscope was introduced through the mouth and advanced to the second portion of the duodenum without difficulty or limitations.  The mucosal surfaces were surveyed very carefully during advancement of the scope and upon withdrawal.  Retroflexion view of the proximal stomach and esophagogastric junction was performed.      FINDINGS:  Four-quadrant distal esophageal erosions-the longest rash approximately 4 mm in dimensions. No Barrett's esophagus. Stomach empty. Small hiatal hernia. Scattered gastric erosions. Multiple 1-2 mm fundal gland type-appearing polyps. No ulcer or infiltrating process. Patent pylorus. Normal first and second portion of the duodenum  THERAPEUTIC / DIAGNOSTIC MANEUVERS PERFORMED:  The gastric because was biopsied. One of the polyps were biopsied.   COMPLICATIONS:  None  IMPRESSION:   Erosive reflux esophagitis. Hiatal hernia. Gastric erosions. Gastric polyp and status post biopsy.  RECOMMENDATIONS:   Begin Dexilant 60 mg daily - Go by my office  for free samples. Weight loss. Followup on pathology. Repeat hepatic function profile in one month.     _______________________________ R. Roetta Sessions, MD FACP [facility] eSigned:  R. Roetta Sessions, MD FACP [facility] 03/31/2013 11:34 AM     CC:

## 2013-03-31 NOTE — OR Nursing (Signed)
Gave 75mg  of Demerol & wasted 25 mg Demerol.  Pixis shows wasted 100mg .  Only wasted 25mg  as witnessed by Ina Homes, EOT.

## 2013-03-31 NOTE — H&P (View-Only) (Signed)
Primary Care Physician:  Carylon Perches, MD Primary Gastroenterologist:  Dr. Jena Gauss  Pre-Procedure History & Physical: HPI:  Cheryl Gutierrez is a 57 y.o. female here for for further evaluation of a 3-4 month history of insidiously worsening abdominal pain.  Patient describes pain as epigastric, sometimes bandlike upper abdominal discomfort may last for hours, sometimes overnight, and just this past weekend lasted for about 3 days before gradually subsiding. Intermittent early satiety. Nausea but no vomiting. Not necessarily related to eating or having a bowel movement. No fever or chills. She denies any typical reflux symptoms and no dysphagia. Ultrasound and HIDA recently demonstrated a fatty appearing pancreas; no evidence of occult gallstone disease.  Gallbladder appeared normal.  Lliver appeared normal. Gallbladder EF 93% on a HIDA without any symptoms. Has not tried any empiric treatment. No new medications recently. No change in weight. No family history of any GI neoplasia. She had a colonoscopy by me for screening purposes in 2008 which revealed a single polyp removed which turned out to be hyperplastic.  No NSAIDs or alcohol.  Past Medical History  Diagnosis Date  . Diabetes mellitus without complication   . Hyperplastic colon polyp     Past Surgical History  Procedure Laterality Date  . Abdominal hysterectomy    . Colonoscopy  07/29/2007    Dr. Jena Gauss- anal papillae, o/w normal rectum, pedunculated polyp,mic sigmoid colon, hyperplastic polyp on bx, the remainder of the colonic mucosa appeared normal    Prior to Admission medications   Medication Sig Start Date End Date Taking? Authorizing Provider  aspirin 81 MG tablet Take 81 mg by mouth daily.     Yes Historical Provider, MD  PARoxetine (PAXIL) 40 MG tablet Take 40 mg by mouth every morning.     Yes Historical Provider, MD    Allergies as of 03/25/2013  . (No Known Allergies)    Family History  Problem Relation Age of Onset  .  Anesthesia problems Neg Hx   . Broken bones Neg Hx   . Cancer Neg Hx   . Clotting disorder Neg Hx   . Collagen disease Neg Hx   . Diabetes Neg Hx   . Dislocations Neg Hx   . Osteoporosis Neg Hx   . Rheumatologic disease Neg Hx   . Scoliosis Neg Hx   . Severe sprains Neg Hx     History   Social History  . Marital Status: Married    Spouse Name: N/A    Number of Children: N/A  . Years of Education: college   Occupational History  . clerical    Social History Main Topics  . Smoking status: Never Smoker   . Smokeless tobacco: Not on file  . Alcohol Use: No  . Drug Use: No  . Sexually Active: Not on file   Other Topics Concern  . Not on file   Social History Narrative  . No narrative on file    Review of Systems: See HPI, otherwise negative ROS  Physical Exam: BP 134/69  Pulse 76  Temp(Src) 98.4 F (36.9 C) (Oral)  Ht 5\' 4"  (1.626 m)  Wt 188 lb 3.2 oz (85.367 kg)  BMI 32.29 kg/m2 General:   Alert,  Well-developed, well-nourished, pleasant and cooperative in NAD Skin:  Intact without significant lesions or rashes. Eyes:  Sclera clear, no icterus.   Conjunctiva pink. Ears:  Normal auditory acuity. Nose:  No deformity, discharge,  or lesions. Mouth:  No deformity or lesions. Neck:  Supple; no masses or  thyromegaly. No significant cervical adenopathy. Lungs:  Clear throughout to auscultation.   No wheezes, crackles, or rhonchi. No acute distress. Heart:  Regular rate and rhythm; no murmurs, clicks, rubs,  or gallops. Abdomen: Somewhat obese. Positive bowel sounds she does have epigastric tenderness to palpation. No appreciable mass or hepatosplenomegaly. Pulses:  Normal pulses noted. Extremities:  Without clubbing or edema.  Impression:  Pleasant 57 year old lady with insidiously progressive intermittent epigastric/upper abdominal pain.  Notes early satiety. Symptoms may last as long as 3 days. Symptoms would be atypical for biliary tract disease and even acid  peptic disease for that matter. She does have some degree of early satiety. There is really not a postprandial component making mesenteric ischemia less likely. Mild pancreatitis would remain in the differential.  Recommendations:  Diagnostic EGD in the near future.The risks, benefits, limitations, alternatives and imponderables have been reviewed with the patient. Potential for esophageal dilation, biopsy, etc. have also been reviewed.  Questions have been answered. All parties agreeable.  CBC, serum lipase and hepatic function profile today  Further recommendations to follow.

## 2013-03-31 NOTE — Interval H&P Note (Signed)
History and Physical Interval Note:  03/31/2013 11:01 AM  Cheryl Gutierrez  has presented today for surgery, with the diagnosis of Abdominal Pain  The various methods of treatment have been discussed with the patient and family. After consideration of risks, benefits and other options for treatment, the patient has consented to  Procedure(s) with comments: ESOPHAGOGASTRODUODENOSCOPY (EGD) (N/A) - 11:30 as a surgical intervention .  The patient's history has been reviewed, patient examined, no change in status, stable for surgery.  I have reviewed the patient's chart and labs.  Questions were answered to the patient's satisfaction.     Benyamin Jeff  No pain in 7 days. Labs reviewed. Everything looked okay except for slightly elevated ALT of 38. EGD today per plan.The risks, benefits, limitations, alternatives and imponderables have been reviewed with the patient. Potential for esophageal dilation, biopsy, etc. have also been reviewed.  Questions have been answered. All parties agreeable.

## 2013-04-01 ENCOUNTER — Encounter: Payer: Self-pay | Admitting: Internal Medicine

## 2013-04-01 ENCOUNTER — Other Ambulatory Visit: Payer: Self-pay | Admitting: Internal Medicine

## 2013-04-01 DIAGNOSIS — R109 Unspecified abdominal pain: Secondary | ICD-10-CM

## 2013-04-01 NOTE — Progress Notes (Unsigned)
Letter from: Corbin Ade  Reason for Letter: Results Review  Send letter to patient.  Send copy of letter with path to referring provider and PCP.   Patient needs a hepatic profile in one month. Office visit with me in 6 weeks

## 2013-04-01 NOTE — Progress Notes (Unsigned)
Letter mailed to pt. Lab order on file. Leighann, please cc pcp. Chelsey, please schedule ov with RMR in 6 weeks.

## 2013-04-01 NOTE — Progress Notes (Signed)
Pt has a appt. With dr. Jena Gauss 05/08/13

## 2013-04-01 NOTE — Progress Notes (Signed)
Called pt. And [facility]

## 2013-04-03 ENCOUNTER — Encounter (HOSPITAL_COMMUNITY): Payer: Self-pay | Admitting: Internal Medicine

## 2013-04-29 ENCOUNTER — Other Ambulatory Visit: Payer: Self-pay

## 2013-04-29 DIAGNOSIS — R109 Unspecified abdominal pain: Secondary | ICD-10-CM

## 2013-05-05 ENCOUNTER — Other Ambulatory Visit: Payer: Self-pay | Admitting: Internal Medicine

## 2013-05-05 LAB — HEPATIC FUNCTION PANEL
ALT: 41 U/L — ABNORMAL HIGH (ref 0–35)
Bilirubin, Direct: 0.1 mg/dL (ref 0.0–0.3)
Total Protein: 6.6 g/dL (ref 6.0–8.3)

## 2013-05-07 ENCOUNTER — Other Ambulatory Visit: Payer: Self-pay | Admitting: Internal Medicine

## 2013-05-07 DIAGNOSIS — R109 Unspecified abdominal pain: Secondary | ICD-10-CM

## 2013-05-07 DIAGNOSIS — K760 Fatty (change of) liver, not elsewhere classified: Secondary | ICD-10-CM

## 2013-05-07 LAB — IRON AND TIBC
Iron: 115 ug/dL (ref 42–145)
UIBC: 176 ug/dL (ref 125–400)

## 2013-05-08 ENCOUNTER — Ambulatory Visit (INDEPENDENT_AMBULATORY_CARE_PROVIDER_SITE_OTHER): Payer: 59 | Admitting: Internal Medicine

## 2013-05-08 ENCOUNTER — Encounter: Payer: Self-pay | Admitting: Internal Medicine

## 2013-05-08 VITALS — BP 127/77 | HR 84 | Temp 98.2°F | Ht 64.0 in | Wt 187.2 lb

## 2013-05-08 DIAGNOSIS — R748 Abnormal levels of other serum enzymes: Secondary | ICD-10-CM | POA: Insufficient documentation

## 2013-05-08 DIAGNOSIS — K219 Gastro-esophageal reflux disease without esophagitis: Secondary | ICD-10-CM | POA: Insufficient documentation

## 2013-05-08 MED ORDER — DEXLANSOPRAZOLE 60 MG PO CPDR: 60.0000 mg | DELAYED_RELEASE_CAPSULE | Freq: Every day | ORAL | Status: DC

## 2013-05-08 NOTE — Patient Instructions (Addendum)
Continue Dexilant 60 mg daily - may go every other day if symptoms are controlled  Loose 15 pounds in next 6 months  Aerobic exercise 30 minutes 3 times weekly  LFT's in 3 months  Office visit in 6 months

## 2013-05-08 NOTE — Progress Notes (Signed)
Primary Care Physician:  Carylon Perches, MD Primary Gastroenterologist:  Dr. Jena Gauss  Pre-Procedure History & Physical: HPI:  Cheryl Gutierrez is a 57 y.o. female here for followup of GERD/erosive reflux esophagitis. Also, history of mild elevation ALT on at least 2 assays. Most recently ALT was mildly up at 41; other liver parameters normal. She has a history type 2 diabetes mellitus. BMI 32. Viral markers for hepatitis B. and C. came back negative. Ferritin normal. She gets very little exercise.  No alcohol. Abdominal pain for which she came to see me for initially has resolved on Dexilant.  Past Medical History  Diagnosis Date  . Hyperplastic colon polyp   . Diabetes mellitus without complication     Controlled with diet  . Hiatal hernia   . Reflux esophagitis     erosive    Past Surgical History  Procedure Laterality Date  . Abdominal hysterectomy    . Colonoscopy  07/29/2007    Dr. Jena Gauss- anal papillae, o/w normal rectum, pedunculated polyp,mic sigmoid colon, hyperplastic polyp on bx, the remainder of the colonic mucosa appeared normal  . Esophagogastroduodenoscopy N/A 03/31/2013    Dr. Darnelle Going reflux esophagitis, hiatal hernia, gastric erosions, gastric polyp.bx= mild chronic inactive gastritis, fundic gland polyp    Prior to Admission medications   Medication Sig Start Date End Date Taking? Authorizing Provider  aspirin 81 MG tablet Take 81 mg by mouth daily.     Yes Historical Provider, MD  cholecalciferol (VITAMIN D) 1000 UNITS tablet Take 1,000 Units by mouth daily.   Yes Historical Provider, MD  PARoxetine (PAXIL) 40 MG tablet Take 40 mg by mouth every morning.     Yes Historical Provider, MD    Allergies as of 05/08/2013  . (No Known Allergies)    Family History  Problem Relation Age of Onset  . Anesthesia problems Neg Hx   . Broken bones Neg Hx   . Cancer Neg Hx   . Clotting disorder Neg Hx   . Collagen disease Neg Hx   . Diabetes Neg Hx   . Dislocations Neg  Hx   . Osteoporosis Neg Hx   . Rheumatologic disease Neg Hx   . Scoliosis Neg Hx   . Severe sprains Neg Hx     History   Social History  . Marital Status: Married    Spouse Name: N/A    Number of Children: N/A  . Years of Education: college   Occupational History  . clerical    Social History Main Topics  . Smoking status: Never Smoker   . Smokeless tobacco: Not on file  . Alcohol Use: No  . Drug Use: No  . Sexual Activity: Not on file   Other Topics Concern  . Not on file   Social History Narrative  . No narrative on file    Review of Systems: See HPI, otherwise negative ROS  Physical Exam: BP 127/77  Pulse 84  Temp(Src) 98.2 F (36.8 C) (Oral)  Ht 5\' 4"  (1.626 m)  Wt 187 lb 3.2 oz (84.913 kg)  BMI 32.12 kg/m2 General:   Alert,  Well-developed, well-nourished, pleasant and cooperative in NAD Skin:  Intact without significant lesions or rashes. Eyes:  Sclera clear, no icterus.   Conjunctiva pink. Ears:  Normal auditory acuity. Nose:  No deformity, discharge,  or lesions. Mouth:  No deformity or lesions. Neck:  Supple; no masses or thyromegaly. No significant cervical adenopathy. Lungs:  Clear throughout to auscultation.   No wheezes, crackles, or  rhonchi. No acute distress. Heart:  Regular rate and rhythm; no murmurs, clicks, rubs,  or gallops. Abdomen: Obese. Positive bowel sounds  Soft and nontender without appreciable mass or hepatosplenomegaly.  Pulses:  Normal pulses noted. Extremities:  Without clubbing or edema.  Impression: 57 year old obese lady with erosive reflux esophagitis-  now much improved on Dexilant. We had a long discussion about potential risk and benefits long-term acid suppression therapy. In this setting the risks or toward by the benefits. Patient would like to try taking Dexilant every other day for the time being. I would go along with this approach and so long as her symptoms are controlled. Elevated ALT likely related to  NALFD.  Recommendations:   Discussed reducing BMI and regular aerobic exercises (i.e. 30 minutes 3 times weekly) as effective therapies for her managing fatty liver, GERD and type 2 diabetes mellitus.   Continue Dexilant 60 mg daily - may take every other day if symptoms are controlled  Loose 15 pounds in next 6 months  Aerobic exercise 30 minutes 3 times weekly  LFT's in 3 months  Office visit in 6 months

## 2013-07-07 ENCOUNTER — Other Ambulatory Visit: Payer: Self-pay

## 2013-07-07 DIAGNOSIS — R109 Unspecified abdominal pain: Secondary | ICD-10-CM

## 2013-07-07 DIAGNOSIS — K760 Fatty (change of) liver, not elsewhere classified: Secondary | ICD-10-CM

## 2013-09-10 ENCOUNTER — Other Ambulatory Visit (HOSPITAL_COMMUNITY): Payer: Self-pay | Admitting: Internal Medicine

## 2013-09-10 DIAGNOSIS — Z139 Encounter for screening, unspecified: Secondary | ICD-10-CM

## 2013-10-03 ENCOUNTER — Ambulatory Visit (HOSPITAL_COMMUNITY)
Admission: RE | Admit: 2013-10-03 | Discharge: 2013-10-03 | Disposition: A | Discharge status: Home / Self Care | Payer: 59 | Source: Ambulatory Visit | Attending: Internal Medicine | Admitting: Internal Medicine

## 2013-10-03 DIAGNOSIS — Z1231 Encounter for screening mammogram for malignant neoplasm of breast: Secondary | ICD-10-CM | POA: Insufficient documentation

## 2013-10-03 DIAGNOSIS — Z139 Encounter for screening, unspecified: Secondary | ICD-10-CM

## 2014-01-20 ENCOUNTER — Ambulatory Visit (INDEPENDENT_AMBULATORY_CARE_PROVIDER_SITE_OTHER): Payer: 59 | Admitting: Orthopedic Surgery

## 2014-01-20 ENCOUNTER — Ambulatory Visit (INDEPENDENT_AMBULATORY_CARE_PROVIDER_SITE_OTHER): Payer: 59

## 2014-01-20 VITALS — BP 109/64 | Ht 64.0 in | Wt 182.0 lb

## 2014-01-20 DIAGNOSIS — M719 Bursopathy, unspecified: Principal | ICD-10-CM

## 2014-01-20 DIAGNOSIS — M25519 Pain in unspecified shoulder: Secondary | ICD-10-CM

## 2014-01-20 DIAGNOSIS — M67919 Unspecified disorder of synovium and tendon, unspecified shoulder: Secondary | ICD-10-CM

## 2014-01-20 MED ORDER — IBUPROFEN 800 MG PO TABS: 800.0000 mg | ORAL_TABLET | Freq: Three times a day (TID) | ORAL | Status: DC | PRN

## 2014-01-20 MED ORDER — TRAMADOL HCL 50 MG PO TABS: 50.0000 mg | ORAL_TABLET | Freq: Four times a day (QID) | ORAL | Status: DC | PRN

## 2014-01-20 NOTE — Progress Notes (Signed)
Patient ID: Cheryl Gutierrez, female   DOB: February 17, 1956, 58 y.o.   MRN: [id number]  Chief Complaint  Patient presents with  . 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 [facility], Problem right shoulder and upper arm pain Present for 6 months No injury Symptoms pain, description throbbing aching Constant 9/10 No x-ray CT or MRI No injection therapy or surgery no medications nothing makes it feel better and feels worse at night  Medical history negative  Surgical history hysterectomy  Medications 40 mg of Paxil daily and vitamin D  Allergies none  Family history grandfather had alcoholism  Mother and father are alive and in good health at 20 and 83 respectively siblings are alive  Review of systems constitutional difficulty sleeping it nose and throat, cardiovascular, respiratory negative GI gastrointestinal heartburn. Musculoskeletal limb and joint pain as described Eyes wears glasses Skin psoriasis/eczema Neurologic negative Endocrine negative cancer none genitourinary negative mental health normal except for depression  Vital signs BP 109/64  Ht 5\' 4"  (1.626 m)  Wt 182 lb (82.555 kg)  BMI 31.22 kg/m2   General appearance: Development, nutrition are normal. Body habitus m/l No gross deformities are noted and grooming normal.  Peripheral vascular system no swelling or varicose veins are noted and pulses are palpable without tenderness, temperature warm to touch no edema.  No palpable lymph nodes are noted in the cervical area or axillae.  The skin overlying the right and left shoulder cervical and thoracic spine is normal without rash, lesion or ulceration  Deep tendon reflexes are normal and equal. And pathologic reflexes such as Hoffman sign are negative.  Sensation remains normal.  The patient is oriented to person place and time, the mood and affect are normal  Ambulation remains normal  Cervical spine no mass or tenderness. Range of  motion is normal. Muscle tone is normal. Skin is normal.   Left shoulder  inspection reveals no tenderness or malalignment. There is no crepitation. The range of motion remains full flexion internal and external rotation. Stability tests are normal in abduction external rotation inferior subluxation test as well as the posterior stress test. Manual muscle testing of the supraspinatus, internal and external rotators 5 over 5  Impingement sign is normal, Hawkins maneuver normal, a.c. joint stress test normal.  Right shoulder: There is tenderness around the anterior deltoid and the posterior aspect of the acromion, internal rotation is limited, forward elevation is limited. External rotation is normal. The patient is stable in abduction external rotation. There is mild weakness of the supraspinatus tendon 4/5 with normal internal and external rotation strength are 5/5. The impingement sign is positive The a.c. joint stress test is normal A Hawkins maneuver is positive  Encounter Diagnoses  Name Primary?  . Pain in joint, shoulder region   . Disorders of bursae and tendons in shoulder region, unspecified Yes    Shoulder Injection Procedure Note   Pre-operative Diagnosis: right  RC Syndrome  Post-operative Diagnosis: same  Indications: pain   Anesthesia: ethyl chloride   Procedure Details   Verbal consent was obtained for the procedure. The shoulder was prepped withalcohol and the skin was anesthetized. A 20 gauge needle was advanced into the subacromial space through posterior approach without difficulty  The space was then injected with 3 ml 1% lidocaine and 1 ml of depomedrol. The injection site was cleansed with isopropyl alcohol and a dressing was applied.  Complications:  None; patient tolerated the procedure well.  Meds ordered this encounter  Medications  . ibuprofen (ADVIL,MOTRIN) 800 MG tablet    Sig: Take 1 tablet (800 mg total) by mouth every 8 (eight) hours as needed.     Dispense:  90 tablet    Refill:  5  . traMADol (ULTRAM) 50 MG tablet    Sig: Take 1 tablet (50 mg total) by mouth every 6 (six) hours as needed.    Dispense:  60 tablet    Refill:  5    Home exercises  Return if no improvement after 6 weeks, call for appointment

## 2014-01-20 NOTE — Patient Instructions (Signed)
Secondary Impingement Syndrome with Rehab  The rotator cuff consists of four muscles that are responsible for much of the function of the shoulder joint. The subacromial bursa is a fluid filled sac that reduces friction between part of the shoulder (acromion) and the rotator cuff tendons. Secondary impingement syndrome is a condition in which the subacromial bursa or tendons of the rotator cuff become inflamed. Inflammation of either of these tendons causes pain and weakness in the shoulder.  SYMPTOMS   Pain, weakness, and/or loss of motion in the shoulder.  Pain that worsens with shoulder function.  Tenderness, swelling, warmth, or redness may occasionally be present over the outer aspect of the shoulder.  A crackling sound (crepitation) when the shoulder is moved. CAUSES  Secondary impingement syndrome is caused by inflammation of the rotator cuff tendons or subacromial bursa. Common mechanisms of injury include:  Repetitive and/or strenuous shoulder movements, especially those above shoulder height.  Direct trauma to the shoulder (uncommon). RISK INCREASES WITH:  Contact sports (football, wrestling, or boxing).  Activities involving arm movement above shoulder height (baseball, volleyball, or racquet sports).  Information systems manager.  Heavy labor.  Previous shoulder injury.  Poor strength and flexibility.  Loose shoulder ligaments. PREVENTION  Warm up and stretch properly before activity.  Allow for adequate recovery between workouts.  Maintain physical fitness:  Strength, flexibility, and endurance.  Cardiovascular fitness.  Learn and use proper technique. When possible, have coach correct improper technique. PROGNOSIS  If treated properly, then secondary shoulder impingement normally resolves with conservative treatment. RELATED COMPLICATIONS   Prolonged healing time, if improperly treated or re-injured.  Recurrent symptoms that result in a chronic  problem.  Other shoulder conditions, such as frozen shoulder or a rotator cuff tear. TREATMENT Treatment initially involves the use of ice and medication to help reduce pain and inflammation. The use of strengthening and stretching exercises may help reduce pain with activity, especially those exercises focused on stabilizing the shoulder blade (scapula). These exercises may be performed at home or with referral to a therapist. If symptoms persist for greater than 6 months despite non-surgical (conservative) treatment, then surgery may be recommended.  MEDICATION   If pain medication is necessary, then nonsteroidal anti-inflammatory medications, such as aspirin and ibuprofen, or other minor pain relievers, such as acetaminophen, are often recommended.  Do not take pain medication for 7 days before surgery.  Prescription pain relievers may be given if deemed necessary by your caregiver. Use only as directed and only as much as you need. COLD THERAPY  Cold treatment (icing) relieves pain and reduces inflammation. Cold treatment should be applied for 10 to 15 minutes every 2 to 3 hours for inflammation and pain and immediately after any activity that aggravates your symptoms. Use ice packs or massage the area with a piece of ice (ice massage). SEEK MEDICAL CARE IF:  Treatment seems to offer no benefit, or the condition worsens.  Any medications produce adverse side effects. EXERCISES See separate sheet  You have received a steroid shot. 15% of patients experience increased pain at the injection site with in the next 24 hours. This is best treated with ice and tylenol extra strength 2 tabs every 8 hours. If you are still having pain please call the office.   Take medication as ordered   Set up appt if pain persists after 6 weeks   You have received a steroid shot. 15% of patients experience increased pain at the injection site with in the next 24 hours.  This is best treated with ice and  tylenol extra strength 2 tabs every 8 hours. If you are still having pain please call the office.

## 2014-05-25 ENCOUNTER — Encounter: Payer: Self-pay | Admitting: Orthopedic Surgery

## 2014-05-25 ENCOUNTER — Ambulatory Visit (INDEPENDENT_AMBULATORY_CARE_PROVIDER_SITE_OTHER): Payer: 59 | Admitting: Orthopedic Surgery

## 2014-05-25 VITALS — BP 105/69 | Ht 64.0 in | Wt 182.0 lb

## 2014-05-25 DIAGNOSIS — M25519 Pain in unspecified shoulder: Secondary | ICD-10-CM | POA: Insufficient documentation

## 2014-05-25 DIAGNOSIS — M25511 Pain in right shoulder: Secondary | ICD-10-CM

## 2014-05-25 MED ORDER — HYDROCODONE-ACETAMINOPHEN 5-325 MG PO TABS: 1.0000 | ORAL_TABLET | Freq: Four times a day (QID) | ORAL | Status: DC | PRN

## 2014-05-25 NOTE — Progress Notes (Signed)
Chief Complaint  Patient presents with  . Follow-up    recurring Rt shoulder pain    Reevaluation right shoulder patient last seen June 05/03/2014. She was treated for rotator cuff syndrome with subacromial injection, ibuprofen and tramadol. She did not get significant relief  Complains of pain when she lies on her right side and pain when she reaches across her chest. She denies any recent trauma she complains of mild weakness in the right shoulder.  Review of systems she's had a history in the past of heartburn and eczema she denies neurologic symptoms  Past Medical History  Diagnosis Date  . Hyperplastic colon polyp   . Diabetes mellitus without complication     Controlled with diet  . Hiatal hernia   . Reflux esophagitis     erosive   BP 105/69  Ht 5\' 4"  (1.626 m)  Wt 182 lb (82.555 kg)  BMI 31.22 kg/m2  Shoulder examination she is well-groomed and well-developed and well-nourished. She is alert and oriented to person place and time her mood is pleasant and her affect is normal. She is tender over the right a.c. joint she has full passive and active range of motion but some pain with internal rotation and pain reaching across her chest. Shoulder stable. Rotator cuff strength grade 5 with pain skin normal pulse normal sensation normal in the cervical spine is nontender in the cervical lymph nodes are nonpalpable  Impingement sign was negative  Impression a.c. joint inflammation  Recommend injection  Inject right a.c. Joint Procedure note right shoulder acromioclavicular joint shoulder  Verbal consent was obtained to inject the  right  Shoulder  Timeout was completed to confirm the injection site is a subacromial space of the  right shoulder   Medication used Depo-Medrol 40 mg and lidocaine 1% 3 cc  Anesthesia was provided by ethyl chloride  The injection was performed in the right acromioclavicular joint. After pinning the skin with alcohol and anesthetized the skin  with ethyl chloride the subacromial space was injected using a 25-gauge needle. There were no complications  Sterile dressing was applied.  Scheduled Meds: Continuous Infusions: PRN Meds:.    Our plan is for her to use Codman exercises, following medication and PT evaluation in 6 weeks Meds ordered this encounter  Medications  . HYDROcodone-acetaminophen (NORCO/VICODIN) 5-325 MG per tablet    Sig: Take 1 tablet by mouth every 6 (six) hours as needed for moderate pain.    Dispense:  56 tablet    Refill:  0

## 2014-05-25 NOTE — Patient Instructions (Signed)
Home exercises  

## 2014-05-29 ENCOUNTER — Other Ambulatory Visit: Payer: Self-pay

## 2014-07-06 ENCOUNTER — Ambulatory Visit: Payer: 59 | Admitting: Orthopedic Surgery

## 2014-09-03 ENCOUNTER — Other Ambulatory Visit (HOSPITAL_COMMUNITY): Payer: Self-pay | Admitting: Internal Medicine

## 2014-09-03 DIAGNOSIS — Z1231 Encounter for screening mammogram for malignant neoplasm of breast: Secondary | ICD-10-CM

## 2014-09-10 ENCOUNTER — Other Ambulatory Visit: Payer: Self-pay | Admitting: *Deleted

## 2014-09-10 ENCOUNTER — Encounter: Payer: Self-pay | Admitting: Orthopedic Surgery

## 2014-09-10 MED ORDER — HYDROCODONE-ACETAMINOPHEN 5-325 MG PO TABS: 1.0000 | ORAL_TABLET | Freq: Four times a day (QID) | ORAL | Status: DC | PRN

## 2014-09-10 NOTE — Telephone Encounter (Signed)
Patient picked up Rx for hydrocodone-actaminophen

## 2014-10-08 ENCOUNTER — Ambulatory Visit (HOSPITAL_COMMUNITY)
Admission: RE | Admit: 2014-10-08 | Discharge: 2014-10-08 | Disposition: A | Discharge status: Home / Self Care | Payer: 59 | Source: Ambulatory Visit | Attending: Internal Medicine | Admitting: Internal Medicine

## 2014-10-08 DIAGNOSIS — Z1231 Encounter for screening mammogram for malignant neoplasm of breast: Secondary | ICD-10-CM | POA: Insufficient documentation

## 2015-07-22 ENCOUNTER — Other Ambulatory Visit (HOSPITAL_COMMUNITY): Payer: Self-pay | Admitting: Internal Medicine

## 2015-07-22 DIAGNOSIS — R945 Abnormal results of liver function studies: Principal | ICD-10-CM

## 2015-07-22 DIAGNOSIS — R7989 Other specified abnormal findings of blood chemistry: Secondary | ICD-10-CM

## 2015-07-27 ENCOUNTER — Ambulatory Visit (HOSPITAL_COMMUNITY)
Admission: RE | Admit: 2015-07-27 | Discharge: 2015-07-27 | Disposition: A | Discharge status: Home / Self Care | Payer: 59 | Source: Ambulatory Visit | Attending: Internal Medicine | Admitting: Internal Medicine

## 2015-07-27 DIAGNOSIS — R7989 Other specified abnormal findings of blood chemistry: Secondary | ICD-10-CM | POA: Insufficient documentation

## 2015-07-27 DIAGNOSIS — K760 Fatty (change of) liver, not elsewhere classified: Secondary | ICD-10-CM | POA: Insufficient documentation

## 2015-07-27 DIAGNOSIS — R945 Abnormal results of liver function studies: Secondary | ICD-10-CM | POA: Diagnosis not present

## 2015-09-13 ENCOUNTER — Other Ambulatory Visit (HOSPITAL_COMMUNITY): Payer: Self-pay | Admitting: Internal Medicine

## 2015-09-13 DIAGNOSIS — Z1231 Encounter for screening mammogram for malignant neoplasm of breast: Secondary | ICD-10-CM

## 2015-10-04 MED FILL — PARoxetine HCL 40 MG TABS: 40 | 90 days supply | Qty: 90 | Fill #1

## 2015-10-11 ENCOUNTER — Ambulatory Visit (HOSPITAL_COMMUNITY)
Admission: RE | Admit: 2015-10-11 | Discharge: 2015-10-11 | Disposition: A | Discharge status: Home / Self Care | Payer: 59 | Source: Ambulatory Visit | Attending: Internal Medicine | Admitting: Internal Medicine

## 2015-10-11 DIAGNOSIS — Z1231 Encounter for screening mammogram for malignant neoplasm of breast: Secondary | ICD-10-CM | POA: Insufficient documentation

## 2015-11-05 ENCOUNTER — Ambulatory Visit (INDEPENDENT_AMBULATORY_CARE_PROVIDER_SITE_OTHER): Payer: 59

## 2015-11-05 ENCOUNTER — Ambulatory Visit (INDEPENDENT_AMBULATORY_CARE_PROVIDER_SITE_OTHER): Payer: 59 | Admitting: Orthopedic Surgery

## 2015-11-05 ENCOUNTER — Encounter: Payer: Self-pay | Admitting: Orthopedic Surgery

## 2015-11-05 VITALS — BP 148/84 | Ht 64.0 in | Wt 188.0 lb

## 2015-11-05 DIAGNOSIS — M542 Cervicalgia: Secondary | ICD-10-CM | POA: Diagnosis not present

## 2015-11-05 DIAGNOSIS — M47812 Spondylosis without myelopathy or radiculopathy, cervical region: Secondary | ICD-10-CM | POA: Diagnosis not present

## 2015-11-05 MED ORDER — DICLOFENAC POTASSIUM 50 MG PO TABS: 50.0000 mg | ORAL_TABLET | Freq: Two times a day (BID) | ORAL | Status: DC

## 2015-11-05 MED FILL — DICLOFENAC POT 50 MG TABLET: 50 | 45 days supply | Qty: 90 | Fill #0

## 2015-11-05 NOTE — Patient Instructions (Addendum)
Call  aph therapy dept to schedule visits

## 2015-11-05 NOTE — Progress Notes (Signed)
Patient ID: Cheryl Gutierrez, female   DOB: 29-Nov-1955, 60 y.o.   MRN: [id number]  Chief Complaint  Patient presents with  . Follow-up    Rt shoulder pain    HPI Cheryl Gutierrez is a 60 y.o. female.  60 year old female previously treated for right shoulder pain comes in complaining of continued dull right shoulder pain for the last few months. Her symptoms include neck pain and loss of motion when turning pain radiating from the cervical spine into the right trapezius right shoulder and right upper arm but not below the elbow. She notices some weakness when she's using her arm and is altered her activities to accommodate she has pain at night and sleeps with her arms above her head to get relief. She is on some hydrocodone for pain.    Review of Systems Review of Systems  Constitutional: Positive for activity change. Negative for fever, chills and fatigue.  Musculoskeletal: Positive for myalgias, neck pain and neck stiffness.  Neurological: Negative for weakness and numbness.    Past Medical History  Diagnosis Date  . Hyperplastic colon polyp   . Diabetes mellitus without complication ([place])     Controlled with diet  . Hiatal hernia   . Reflux esophagitis     erosive    Past Surgical History  Procedure Laterality Date  . Abdominal hysterectomy    . Colonoscopy  07/29/2007    Dr. Gala Romney- anal papillae, o/w normal rectum, pedunculated polyp,mic sigmoid colon, hyperplastic polyp on bx, the remainder of the colonic mucosa appeared normal  . Esophagogastroduodenoscopy N/A 03/31/2013    Dr. Raliegh Scarlet reflux esophagitis, hiatal hernia, gastric erosions, gastric polyp.bx= mild chronic inactive gastritis, fundic gland polyp    Family History  Problem Relation Age of Onset  . Anesthesia problems Neg Hx   . Broken bones Neg Hx   . Cancer Neg Hx   . Clotting disorder Neg Hx   . Collagen disease Neg Hx   . Diabetes Neg Hx   . Dislocations Neg Hx   . Osteoporosis Neg Hx   .  Rheumatologic disease Neg Hx   . Scoliosis Neg Hx   . Severe sprains Neg Hx     Social History Social History  Substance Use Topics  . Smoking status: Never Smoker   . Smokeless tobacco: None  . Alcohol Use: No    No Known Allergies  Current Outpatient Prescriptions  Medication Sig Dispense Refill  . cholecalciferol (VITAMIN D) 1000 UNITS tablet Take 1,000 Units by mouth daily.    Marland Kitchen HYDROcodone-acetaminophen (NORCO/VICODIN) 5-325 MG per tablet Take 1 tablet by mouth every 6 (six) hours as needed for moderate pain. 56 tablet 0  . PARoxetine (PAXIL) 40 MG tablet Take 40 mg by mouth every morning.      . diclofenac (CATAFLAM) 50 MG tablet Take 1 tablet (50 mg total) by mouth 2 (two) times daily. 90 tablet 3   No current facility-administered medications for this visit.       Physical Exam  BP 148/84 mmHg  Ht 5\' 4"  (1.626 m)  Wt 188 lb (85.276 kg)  BMI 32.25 kg/m2  Physical Exam  Constitutional: She is oriented to person, place, and time. She appears well-developed and well-nourished. No distress.  Cardiovascular: Normal rate and intact distal pulses.   Neurological: She is alert and oriented to person, place, and time. She has normal reflexes. She exhibits normal muscle tone. Coordination normal.  Skin: Skin is warm and dry. No rash noted. She is  not diaphoretic. No erythema. No pallor.  Psychiatric: She has a normal mood and affect. Her behavior is normal. Judgment and thought content normal.    Ortho Exam Cervical spine exam shows tenderness in midline of the cervical spine increases over the right trapezius muscle no tenderness over the right shoulder or right upper arm  She has painful rotation and extension of the cervical spine with a negative Spurling sign  She also has normal range of motion of the shoulder negative impingement sign full range of motion of the shoulder includes normal strength normal stability and the neurovascular exam remains intact in terms of  sensation and reflexes   Gait: Normal laboratory status without nystagmus   Data Reviewed Today's x-rays are ordered and independently reviewed and I see cervical spondylosis and the C4-5 region on the uncovertebral joints and loss of normal cervical lordosis disc space show very minimal narrowing at C6 and 7.    Assessment  Encounter Diagnoses  Name Primary?  . Neck pain Yes  . Cervical spondylosis without myelopathy       Plan  Recommend a home physical therapy program after visit to the therapist for cervical spondylosis to work on range of motion and strengthening Oral anti-inflammatory Six-week follow-up

## 2015-11-16 ENCOUNTER — Ambulatory Visit (HOSPITAL_COMMUNITY): Payer: 59 | Attending: Orthopedic Surgery | Admitting: Physical Therapy

## 2015-11-16 DIAGNOSIS — R293 Abnormal posture: Secondary | ICD-10-CM | POA: Diagnosis not present

## 2015-11-16 DIAGNOSIS — M79601 Pain in right arm: Secondary | ICD-10-CM | POA: Diagnosis not present

## 2015-11-16 DIAGNOSIS — M542 Cervicalgia: Secondary | ICD-10-CM | POA: Insufficient documentation

## 2015-11-16 NOTE — Therapy (Signed)
[facility] [address] [place], [place], [address] Phone: [phone]   Fax:  [phone]  Physical Therapy Evaluation  Patient Details  Name: Cheryl Gutierrez MRN: [id number] Date of Birth: [birth year] Referring Provider: Arther Abbott   Encounter Date: 11/16/2015      PT End of Session - 11/16/15 1806    Visit Number 1   Number of Visits 12   Date for PT Re-Evaluation 12/14/15   Authorization Type [employer]    Authorization Time Period 11/16/15 to 12/28/15   PT Start Time 1655   PT Stop Time 1735   PT Time Calculation (min) 40 min   Activity Tolerance Patient tolerated treatment well   Behavior During Therapy [facility] for tasks assessed/performed      Past Medical History  Diagnosis Date  . Hyperplastic colon polyp   . Diabetes mellitus without complication ([place])     Controlled with diet  . Hiatal hernia   . Reflux esophagitis     erosive    Past Surgical History  Procedure Laterality Date  . Abdominal hysterectomy    . Colonoscopy  07/29/2007    Dr. Gala Romney- anal papillae, o/w normal rectum, pedunculated polyp,mic sigmoid colon, hyperplastic polyp on bx, the remainder of the colonic mucosa appeared normal  . Esophagogastroduodenoscopy N/A 03/31/2013    Dr. Raliegh Scarlet reflux esophagitis, hiatal hernia, gastric erosions, gastric polyp.bx= mild chronic inactive gastritis, fundic gland polyp    There were no vitals filed for this visit.  Visit Diagnosis:  Abnormal posture - Plan: PT plan of care cert/re-cert  Cervicalgia - Plan: PT plan of care cert/re-cert  Pain In Right Arm - Plan: PT plan of care cert/re-cert      Subjective Assessment - 11/16/15 1659    Subjective Patient has been working with neck/arm pain the past couple of years; she has been gettting shots from her MD but the pain has kept getting worse, just won't stop. Does have symptoms running down her R arm; the pain does not stop her from doing anything but  she has pain after activities. The hardest thing for her to do right now is  using her computer work station- putting her arm on top of her head makes the pain stop. Pain can get up to an 8/10 at worst and a 3/10 at its best. Hard to get comfortable for sleep. No history of MVAs.    Pertinent History Diabetes but well controlled; no signficant surgical history    How long can you sit comfortably? 30 minutes    How long can you stand comfortably? no limits    How long can you walk comfortably? no limits    Diagnostic tests imaging done by MD recently    Patient Stated Goals get rid of pain    Currently in Pain? Yes   Pain Score 7    Pain Location Neck   Pain Orientation Right   Pain Descriptors / Indicators Constant;Aching;Numbness   Pain Type Chronic pain   Pain Radiating Towards all the way down to fingers    Pain Onset More than a month ago   Pain Frequency Constant   Aggravating Factors  moving neck in general and using R UE, activity in general    Pain Relieving Factors pressure on upper traps R, heat    Effect of Pain on Daily Activities does not stop her from doing anything, just very painful later  [facility] PT Assessment - 11/16/15 0001    Assessment   Medical Diagnosis HEP for cervical spondylosis    Referring Provider Arther Abbott    Onset Date/Surgical Date --  chronic    Next MD Visit May 8th with Dr. Aline Brochure    Precautions   Precautions None   Restrictions   Weight Bearing Restrictions No   Balance Screen   Has the patient fallen in the past 6 months No   Has the patient had a decrease in activity level because of a fear of falling?  No   Is the patient reluctant to leave their home because of a fear of falling?  No   Prior Function   Level of Independence Independent;Independent with basic ADLs   Vocation Full time employment   Vocation Requirements clerical    Leisure crochet and paint    Observation/Other Assessments   Observations 1st rib  mobiliity clear; ROOS test negative; Vertebral artery test negative; some pain relief with manual traction   increased R UE symptoms with R rotation/extension    Focus on Therapeutic Outcomes (FOTO)  44% limited    AROM   Overall AROM Comments pain with active R rotation and extension   Right Shoulder Internal Rotation --  T12/L1    Right Shoulder External Rotation --  T3   Left Shoulder Internal Rotation --  T12/L1    Left Shoulder External Rotation --  T3   Cervical Flexion 52   Cervical Extension 28   Cervical - Right Side Bend 28   Cervical - Left Side Bend 40   Cervical - Right Rotation 70   Cervical - Left Rotation 65   Thoracic Flexion mod limitation    Thoracic Extension mod limitation    Thoracic - Right Side Bend min limitation    Thoracic - Left Side Bend min limitation    Thoracic - Right Rotation min limitation    Thoracic - Left Rotation min limtation    Strength   Cervical Flexion 4+/5   Cervical Extension 4+/5   Cervical - Right Side Bend 4+/5   Cervical - Left Side Bend 4+/5   Palpation   Palpation comment significatn muscle tightness upper traps, moderate ithgtness levator scap and cervical extensors                            PT Education - 11/16/15 1806    Education provided Yes   Education Details prognosis, paln of care, HEP    Person(s) Educated Patient   Methods Explanation;Demonstration;Handout   Comprehension Verbalized understanding;Returned demonstration;Need further instruction          PT Short Term Goals - 11/16/15 1815    PT SHORT TERM GOAL #1   Title Patient will demonstrate cervical ROM WFL in all directions in order to assist in reducing pain and improving overall function    Time 3   Period Weeks   Status New   PT SHORT TERM GOAL #2   Title Patient will demonstrate at least a 50% reduction in muscle tightness and knotting in shoulder and cervical region in order to assist in in increasing range and reducing  pain    Time 3   Period Weeks   Status New   PT SHORT TERM GOAL #3   Title Patient will maintain correct functional posture at least 75% of the time during all functional situations, including at work and at home, in order to assist  in reducing pain    Time 3   Period Weeks   Status New   PT SHORT TERM GOAL #4   Title Patient to be indpendent in correctly and consistently performing appropriate HEP, to be updated PRN    Time 3   Period Weeks   Status New           PT Long Term Goals - 11/16/15 1817    PT LONG TERM GOAL #1   Title Patient will experience no more than 2/10 pain in her cervical region and R UE in order to assist in improving QOL and functional task performance    Time 6   Period Weeks   Status New   PT LONG TERM GOAL #2   Title Patient to experience reduction of R UE symptoms by at least 50% in order to assist in improving QOL and overall functional task performance    Time 6   Period Weeks   Status New   PT LONG TERM GOAL #3   Title Patient to report she has had improved sleeping patterns and has been able to sleep the entire night without pain in order to improve overall QOL    Time 6   Period Weeks   Status New   PT LONG TERM GOAL #4   Title Patient to report that she has been able to perform all functional tasks at home with pain no more than 2/10 and no exacerbation of pain afterwards in order to improve QOL and functional task perforamnce    Time 6   Period Weeks   Status New               Plan - 11/16/15 1808    Clinical Impression Statement Patient arrives with complaints of R shoulder pain, however MD believes that this is coming from her neck and has written the referral to account for this. Patient reports that she does have pain symptoms going all the way down her arm, and that while her pain does not stop her from doing anything, she has quite a bit of pain after performing functional tasks. Upon examination, patient reveals cervical  stiffness, significant muscle knotting and tightness, poor posture, and reduced functional task performance toelrance and skill at this time; patient also reveals impaired QOL as evidenced by pain after performing functional tasks and impaired sleep. At this time recommend skilled PT services to address functional limtations and assist in reaching optimal level of function; patient reports that she would like to try just 3 sessions and see how she feels before scheduling more at this time.    Pt will benefit from skilled therapeutic intervention in order to improve on the following deficits Hypomobility;Pain;Increased muscle spasms;Decreased range of motion;Improper body mechanics;Postural dysfunction   Rehab Potential Good   PT Frequency 2x / week   PT Duration 6 weeks   PT Treatment/Interventions ADLs/Self Care Home Management;Biofeedback;Cryotherapy;Moist Heat;Therapeutic activities;Therapeutic exercise;Balance training;Neuromuscular re-education;Patient/family education;Manual techniques;Passive range of motion;Energy conservation;Taping   PT Next Visit Plan review HEP and goals; postural training and strengthening, manual PRN, functional stretching of musculature    PT Home Exercise Plan given   Consulted and Agree with Plan of Care Patient         Problem List Patient Active Problem List   Diagnosis Date Noted  . Arthralgia of acromioclavicular joint 05/25/2014  . GERD (gastroesophageal reflux disease) 05/08/2013  . Elevated liver enzymes 05/08/2013  . Abdominal pain 03/25/2013  . Rotator cuff syndrome of left  shoulder 07/31/2011    Deniece Ree PT, DPT [phone]  [facility] [address] [place], [place], [address] Phone: [phone]   Fax:  [phone]  Name: Cheryl Gutierrez MRN: [id number] Date of Birth: 1955-11-24

## 2015-11-16 NOTE — Patient Instructions (Signed)
3D CERVICAL EXCURSIONS, 1X10 EACH DIRECTION, 2X/DAY    Shoulder Rolls  Sit with arm resting on a table (or bed). Start with arm circles, focusing on moving the shoulder while keeping the arm resting on the table. Do 15 repetitions circling backwards only.  Repeat 15 times, twice a day.    Cervical Retraction  Always start by sitting back fully in the chair with back comfortably resting against the back of the chair.  With control, slowly lean your chest and shoulders back without straining the neck.   *Imagine someone is trying to kiss you that you don't want to kiss and pull back away keeping your eyes forward*  Hold 1-2 seconds and return to neutral.  Repeat 10-15 times, twice a day.

## 2015-11-25 ENCOUNTER — Ambulatory Visit (HOSPITAL_COMMUNITY): Payer: 59

## 2015-11-25 DIAGNOSIS — M542 Cervicalgia: Secondary | ICD-10-CM | POA: Diagnosis not present

## 2015-11-25 DIAGNOSIS — M79601 Pain in right arm: Secondary | ICD-10-CM

## 2015-11-25 DIAGNOSIS — R293 Abnormal posture: Secondary | ICD-10-CM

## 2015-11-25 NOTE — Therapy (Signed)
[facility] [address] [place], [place], [address] Phone: [phone]   Fax:  [phone]  Physical Therapy Treatment  Patient Details  Name: Cheryl Gutierrez MRN: [id number] Date of Birth: 1955-09-19 Referring Provider: Arther Abbott   Encounter Date: 11/25/2015      PT End of Session - 11/25/15 1610    Visit Number 2   Number of Visits 12   Date for PT Re-Evaluation 12/14/15   Authorization Type [employer]    Authorization Time Period 11/16/15 to 12/28/15   PT Start Time 1603   PT Stop Time 1642   PT Time Calculation (min) 39 min   Activity Tolerance Patient tolerated treatment well   Behavior During Therapy [facility] for tasks assessed/performed      Past Medical History  Diagnosis Date  . Hyperplastic colon polyp   . Diabetes mellitus without complication ([place])     Controlled with diet  . Hiatal hernia   . Reflux esophagitis     erosive    Past Surgical History  Procedure Laterality Date  . Abdominal hysterectomy    . Colonoscopy  07/29/2007    Dr. Gala Romney- anal papillae, o/w normal rectum, pedunculated polyp,mic sigmoid colon, hyperplastic polyp on bx, the remainder of the colonic mucosa appeared normal  . Esophagogastroduodenoscopy N/A 03/31/2013    Dr. Raliegh Scarlet reflux esophagitis, hiatal hernia, gastric erosions, gastric polyp.bx= mild chronic inactive gastritis, fundic gland polyp    There were no vitals filed for this visit.      Subjective Assessment - 11/25/15 1601    Subjective Pt stated main c/o today tingling from elbow to tip of thumb and pointer finger, pain reduced Rt shoulder pain scale 3/10.  Reports compliance with HEP with reports of dizziness with head movements.  Pt has stopped medication and reports dizziness resolved.     Pertinent History Diabetes but well controlled; no signficant surgical history    Patient Stated Goals get rid of pain    Currently in Pain? Yes   Pain Score 3    Pain Location  Neck   Pain Orientation Right   Pain Descriptors / Indicators Aching;Tingling  tingling Rt thumb and pointer finger from elbow down   Pain Type Chronic pain   Pain Radiating Towards Rt elbow to Rt thumb and tip of pointer finger   Pain Onset More than a month ago   Pain Frequency Constant  aching constant, tingling intermittent   Aggravating Factors  moving neck in general and using R UE, activity in general   Pain Relieving Factors pressure on upper traps R, heat   Effect of Pain on Daily Activities does not stop her from doing anything, just very painful later             [facility] Adult PT Treatment/Exercise - 11/25/15 0001    Exercises   Exercises Neck   Neck Exercises: Seated   Neck Retraction 10 reps;5 secs   Cervical Rotation Both;10 reps   Cervical Rotation Limitations 3D cervical excursion 10x each   X to V 10 reps   W Back 10 reps   Shoulder Rolls Backwards;10 reps   Shoulder Rolls Limitations up, back and relax   Postural Training Educated on importance of proper posture   Other Seated Exercise scapular retraction 10x 5"   Manual Therapy   Manual Therapy Soft tissue mobilization   Manual therapy comments Performed separate of rest of treatment; Supine position with LE elevated   Soft tissue  mobilization Upper traps, cervical extensors and scalenes           PT Short Term Goals - 11/16/15 1815    PT SHORT TERM GOAL #1   Title Patient will demonstrate cervical ROM WFL in all directions in order to assist in reducing pain and improving overall function    Time 3   Period Weeks   Status New   PT SHORT TERM GOAL #2   Title Patient will demonstrate at least a 50% reduction in muscle tightness and knotting in shoulder and cervical region in order to assist in in increasing range and reducing pain    Time 3   Period Weeks   Status New   PT SHORT TERM GOAL #3   Title Patient will maintain correct functional posture at least 75% of the time during all functional  situations, including at work and at home, in order to assist in reducing pain    Time 3   Period Weeks   Status New   PT SHORT TERM GOAL #4   Title Patient to be indpendent in correctly and consistently performing appropriate HEP, to be updated PRN    Time 3   Period Weeks   Status New           PT Long Term Goals - 11/16/15 1817    PT LONG TERM GOAL #1   Title Patient will experience no more than 2/10 pain in her cervical region and R UE in order to assist in improving QOL and functional task performance    Time 6   Period Weeks   Status New   PT LONG TERM GOAL #2   Title Patient to experience reduction of R UE symptoms by at least 50% in order to assist in improving QOL and overall functional task performance    Time 6   Period Weeks   Status New   PT LONG TERM GOAL #3   Title Patient to report she has had improved sleeping patterns and has been able to sleep the entire night without pain in order to improve overall QOL    Time 6   Period Weeks   Status New   PT LONG TERM GOAL #4   Title Patient to report that she has been able to perform all functional tasks at home with pain no more than 2/10 and no exacerbation of pain afterwards in order to improve QOL and functional task perforamnce    Time 6   Period Weeks   Status New               Plan - 11/25/15 1619    Clinical Impression Statement Reviewed goals, complaince and technqiue with HEP exercises and copy of evaluation given to pt.  Session focus on improving cervical mobility and education on importance of proper posture to reduce radicular symptoms down Rt UE and reduce stress on cervical musculature.  Pt able to complete all exercises with minimal verbal, tactile cueing and demonstration.  Pt reports decreased sensation of tingling  with posture strengthening exercises.  Ended session manual soft tissue mobilization technqiues to reduce tightness in cervical musculature, multiple spasms palpatted upper trap  region, able to reduce though unable to fully resolve this session.  End of session no reports of pain and minimal tingling to Rt UE.     Rehab Potential Good   PT Frequency 2x / week   PT Duration 6 weeks   PT Treatment/Interventions ADLs/Self Care Home Management;Biofeedback;Cryotherapy;Moist Heat;Therapeutic activities;Therapeutic  exercise;Balance training;Neuromuscular re-education;Patient/family education;Manual techniques;Passive range of motion;Energy conservation;Taping   PT Next Visit Plan Next session progress postural strengtheing with standing theraband, add corner and upper trap stretches, manual PRN   PT Home Exercise Plan Reviewed, no additional exercises this session.      Patient will benefit from skilled therapeutic intervention in order to improve the following deficits and impairments:  Hypomobility, Pain, Increased muscle spasms, Decreased range of motion, Improper body mechanics, Postural dysfunction  Visit Diagnosis: Abnormal posture  Cervicalgia  Pain In Right Arm     Problem List Patient Active Problem List   Diagnosis Date Noted  . Arthralgia of acromioclavicular joint 05/25/2014  . GERD (gastroesophageal reflux disease) 05/08/2013  . Elevated liver enzymes 05/08/2013  . Abdominal pain 03/25/2013  . Rotator cuff syndrome of left shoulder 07/31/2011   Ihor Austin, LPTA; CBIS [phone]  Aldona Lento 11/25/2015, 4:49 PM  [facility] [address] [place], [place], [address] Phone: [phone]   Fax:  [phone]  Name: Cheryl Gutierrez MRN: [id number] Date of Birth: 06-Dec-1955

## 2015-11-29 ENCOUNTER — Ambulatory Visit (HOSPITAL_COMMUNITY): Payer: 59 | Admitting: Physical Therapy

## 2015-11-29 DIAGNOSIS — R293 Abnormal posture: Secondary | ICD-10-CM | POA: Diagnosis not present

## 2015-11-29 DIAGNOSIS — M79601 Pain in right arm: Secondary | ICD-10-CM

## 2015-11-29 DIAGNOSIS — M542 Cervicalgia: Secondary | ICD-10-CM

## 2015-11-29 NOTE — Therapy (Signed)
[place], [place], [address] Phone: [phone]   Fax:  [phone]  Physical Therapy Treatment  Patient Details  Name: Cheryl Gutierrez MRN: [id number] Date of Birth: 15-Aug-1955 Referring Provider: Arther Abbott   Encounter Date: 11/29/2015      PT End of Session - 11/29/15 1805    Visit Number 3   Number of Visits 12   Date for PT Re-Evaluation 12/14/15   Authorization Type [employer]    Authorization Time Period 11/16/15 to 12/28/15   PT Start Time [id number]   PT Stop Time 1556   PT Time Calculation (min) 40 min   Activity Tolerance Patient tolerated treatment well   Behavior During Therapy [facility] for tasks assessed/performed      Past Medical History  Diagnosis Date  . Hyperplastic colon polyp   . Diabetes mellitus without complication ([place])     Controlled with diet  . Hiatal hernia   . Reflux esophagitis     erosive    Past Surgical History  Procedure Laterality Date  . Abdominal hysterectomy    . Colonoscopy  07/29/2007    Dr. Gala Romney- anal papillae, o/w normal rectum, pedunculated polyp,mic sigmoid colon, hyperplastic polyp on bx, the remainder of the colonic mucosa appeared normal  . Esophagogastroduodenoscopy N/A 03/31/2013    Dr. Raliegh Scarlet reflux esophagitis, hiatal hernia, gastric erosions, gastric polyp.bx= mild chronic inactive gastritis, fundic gland polyp    There were no vitals filed for this visit.      Subjective Assessment - 11/29/15 1518    Subjective Patient rerpots taht her pain is getting better however she is continuing to have numbness and tingling, which remains her main complaint.    Currently in Pain? No/denies                         [facility] Adult PT Treatment/Exercise - 11/29/15 0001    Neck Exercises: Standing   Other Standing Exercises standing pushups at wall 1x10   Other Standing Exercises standing rows, scapular retraction, extension with green TB  1x10   Neck Exercises: Seated   Neck Retraction 15 reps   Shoulder Rolls Backwards;15 reps   Shoulder Rolls Limitations up, back, down, relax    Other Seated Exercise 3D cervical excursions 1x15; 3D thoracic excursions 1x15    Neck Exercises: Supine   Other Supine Exercise serratus anterior punches 2#, 1x10   Neck Exercises: Prone   Other Prone Exercise prone Is, Ts, Ws 1x10; increased numbess with Is    Neck Exercises: Stretches   Upper Trapezius Stretch 2 reps;30 seconds   Corner Stretch 2 reps;30 seconds                PT Education - 11/29/15 1805    Education provided Yes   Education Details advanced HEP next session    Person(s) Educated Patient   Methods Explanation   Comprehension Verbalized understanding          PT Short Term Goals - 11/16/15 1815    PT SHORT TERM GOAL #1   Title Patient will demonstrate cervical ROM WFL in all directions in order to assist in reducing pain and improving overall function    Time 3   Period Weeks   Status New   PT SHORT TERM GOAL #2   Title Patient will demonstrate at least a 50% reduction in muscle tightness and knotting in shoulder and cervical region in order to  assist in in increasing range and reducing pain    Time 3   Period Weeks   Status New   PT SHORT TERM GOAL #3   Title Patient will maintain correct functional posture at least 75% of the time during all functional situations, including at work and at home, in order to assist in reducing pain    Time 3   Period Weeks   Status New   PT SHORT TERM GOAL #4   Title Patient to be indpendent in correctly and consistently performing appropriate HEP, to be updated PRN    Time 3   Period Weeks   Status New           PT Long Term Goals - 11/16/15 1817    PT LONG TERM GOAL #1   Title Patient will experience no more than 2/10 pain in her cervical region and R UE in order to assist in improving QOL and functional task performance    Time 6   Period Weeks    Status New   PT LONG TERM GOAL #2   Title Patient to experience reduction of R UE symptoms by at least 50% in order to assist in improving QOL and overall functional task performance    Time 6   Period Weeks   Status New   PT LONG TERM GOAL #3   Title Patient to report she has had improved sleeping patterns and has been able to sleep the entire night without pain in order to improve overall QOL    Time 6   Period Weeks   Status New   PT LONG TERM GOAL #4   Title Patient to report that she has been able to perform all functional tasks at home with pain no more than 2/10 and no exacerbation of pain afterwards in order to improve QOL and functional task perforamnce    Time 6   Period Weeks   Status New               Plan - 11/29/15 1805    Clinical Impression Statement Focused on functional postural strengthening and stretching today, with introduction of more advanced exercises for psoture such as I, W, Ys and serratus punches; patient able to perform all well but did have increase in numbness/tingling symtpoms with some lof these, however this resolved after end of exercise. Manual not performed today due to pain 0/10. Patient remains adamant that she is feeling much better and would only like to do 3 treatments, making next session her last; she reports she will call to schedule this in the morning.    Rehab Potential Good   PT Frequency 2x / week   PT Duration 6 weeks   PT Treatment/Interventions ADLs/Self Care Home Management;Biofeedback;Cryotherapy;Moist Heat;Therapeutic activities;Therapeutic exercise;Balance training;Neuromuscular re-education;Patient/family education;Manual techniques;Passive range of motion;Energy conservation;Taping   PT Next Visit Plan focus on advanced HEP as patient continues to request that next session be her last    PT Home Exercise Plan Reviewed, no additional exercises this session.   Consulted and Agree with Plan of Care Patient      Patient  will benefit from skilled therapeutic intervention in order to improve the following deficits and impairments:  Hypomobility, Pain, Increased muscle spasms, Decreased range of motion, Improper body mechanics, Postural dysfunction  Visit Diagnosis: Abnormal posture  Cervicalgia  Pain In Right Arm     Problem List Patient Active Problem List   Diagnosis Date Noted  . Arthralgia of acromioclavicular joint 05/25/2014  .  GERD (gastroesophageal reflux disease) 05/08/2013  . Elevated liver enzymes 05/08/2013  . Abdominal pain 03/25/2013  . Rotator cuff syndrome of left shoulder 07/31/2011    Deniece Ree PT, DPT [facility] [address] [place], [place], [address] Phone: [phone]   Fax:  [phone]  Name: Cheryl Gutierrez MRN: [id number] Date of Birth: [birth year]

## 2015-12-06 ENCOUNTER — Ambulatory Visit (HOSPITAL_COMMUNITY): Payer: 59 | Admitting: Physical Therapy

## 2015-12-06 DIAGNOSIS — M542 Cervicalgia: Secondary | ICD-10-CM | POA: Diagnosis not present

## 2015-12-06 DIAGNOSIS — R293 Abnormal posture: Secondary | ICD-10-CM

## 2015-12-06 DIAGNOSIS — M79601 Pain in right arm: Secondary | ICD-10-CM | POA: Diagnosis not present

## 2015-12-06 NOTE — Therapy (Signed)
[facility] [address] [place], [place], [address] Phone: [phone]   Fax:  [phone]  Physical Therapy Treatment  Patient Details  Name: Cheryl Gutierrez MRN: [id number] Date of Birth: 07/19/1956 Referring Provider: Arther Abbott   Encounter Date: 12/06/2015      PT End of Session - 12/06/15 1205    Visit Number 4   Number of Visits 4   Authorization Type UMR UHC    Authorization Time Period 11/16/15 to 12/28/15   PT Start Time 0945   PT Stop Time 1030   PT Time Calculation (min) 45 min   Activity Tolerance Patient tolerated treatment well   Behavior During Therapy [facility] for tasks assessed/performed      Past Medical History  Diagnosis Date  . Hyperplastic colon polyp   . Diabetes mellitus without complication ([place])     Controlled with diet  . Hiatal hernia   . Reflux esophagitis     erosive    Past Surgical History  Procedure Laterality Date  . Abdominal hysterectomy    . Colonoscopy  07/29/2007    Dr. Gala Romney- anal papillae, o/w normal rectum, pedunculated polyp,mic sigmoid colon, hyperplastic polyp on bx, the remainder of the colonic mucosa appeared normal  . Esophagogastroduodenoscopy N/A 03/31/2013    Dr. Raliegh Scarlet reflux esophagitis, hiatal hernia, gastric erosions, gastric polyp.bx= mild chronic inactive gastritis, fundic gland polyp    There were no vitals filed for this visit.      Subjective Assessment - 12/06/15 0947    Subjective patient continues to report that she is feeling much better, not achingn as much but still having tingling in her fingers. There is nothing really hard for her to do, the tingling is just difficult.    Pertinent History Diabetes but well controlled; no signficant surgical history    How long can you sit comfortably? 4/24- no limits    How long can you stand comfortably? 4/24- no limits    How long can you walk comfortably? 4/24- no limits    Patient Stated Goals get rid of  pain    Currently in Pain? No/denies            [facility] PT Assessment - 12/06/15 0001    Observation/Other Assessments   Focus on Therapeutic Outcomes (FOTO)  35% limited    AROM   Right Shoulder Internal Rotation --  L3   Right Shoulder External Rotation --  T3   Left Shoulder Internal Rotation --  L3    Left Shoulder External Rotation --  T3   Cervical Flexion 51   Cervical Extension 31   Cervical - Right Side Bend 31   Cervical - Left Side Bend 37   Cervical - Right Rotation 80   Cervical - Left Rotation 64   Thoracic Flexion min limitation    Thoracic Extension min limitation    Thoracic - Right Side [facility]    Thoracic - Left Side [facility]    Thoracic - Right Rotation [facility]    Thoracic - Left Rotation [facility]    Strength   Cervical Flexion 4+/5   Cervical Extension 4+/5   Cervical - Right Side Bend 4+/5   Cervical - Left Side Bend 5/5   Palpation   Palpation comment min-modeate tightness upper traps/levator scap/cervical extenors                      [facility] Adult PT Treatment/Exercise - 12/06/15 0001  Exercises   Exercises Neck   Neck Exercises: Machines for Strengthening   UBE (Upper Arm Bike) 5 min with 2 min fwd, and 3 backwards at level 1   Neck Exercises: Theraband   Scapula Retraction Red;5 reps   Shoulder Extension --  Pt educated on shoulder extension with Red TB to add to HEP   Horizontal ABduction Green;5 reps   Neck Exercises: Standing   Wall Push Ups 10 reps   Upper Extremity D1 10 reps;Theraband;Extension  Bilaterally with red TB   Other Standing Exercises punches with red TB 2x10 reps   Neck Exercises: Seated   Neck Retraction 15 reps   Neck Exercises: Stretches   Corner Stretch 30 seconds;2 reps                  PT Short Term Goals - 12/06/15 0959    PT SHORT TERM GOAL #1   Title Patient will demonstrate cervical ROM WFL in all directions in order to assist in reducing pain and improving overall function    Time 3    Period Weeks   Status Partially Met   PT SHORT TERM GOAL #2   Title Patient will demonstrate at least a 50% reduction in muscle tightness and knotting in shoulder and cervical region in order to assist in in increasing range and reducing pain    Time 3   Period Weeks   Status Achieved   PT SHORT TERM GOAL #3   Title Patient will maintain correct functional posture at least 75% of the time during all functional situations, including at work and at home, in order to assist in reducing pain    Time 3   Period Weeks   Status Achieved   PT SHORT TERM GOAL #4   Title Patient to be indpendent in correctly and consistently performing appropriate HEP, to be updated PRN    Baseline 4/24-   Time 3   Period Weeks   Status Achieved           PT Long Term Goals - 12/06/15 1000    PT LONG TERM GOAL #1   Title Patient will experience no more than 2/10 pain in her cervical region and R UE in order to assist in improving QOL and functional task performance    Baseline 4/24- pain gone, tingling is only remaining symptom    Time 6   Period Weeks   Status Achieved   PT LONG TERM GOAL #2   Title Patient to experience reduction of R UE symptoms by at least 50% in order to assist in improving QOL and overall functional task performance    Baseline 4/24- no change in intensity or frequency    Time 6   Period Weeks   Status On-going   PT LONG TERM GOAL #3   Title Patient to report she has had improved sleeping patterns and has been able to sleep the entire night without pain in order to improve overall QOL    Time 6   Period Weeks   Status Achieved   PT LONG TERM GOAL #4   Title Patient to report that she has been able to perform all functional tasks at home with pain no more than 2/10 and no exacerbation of pain afterwards in order to improve QOL and functional task perforamnce    Time 6   Period Weeks   Status Achieved               Plan -  12/06/15 1003    Clinical Impression  Statement Re-assessment performed today. Patient does show general improvmenet as evidenced by improved cervical ROM in some directions, significant reductions in pain, shows improved posture and reduced muscle tension and knotting, and reoprted improved functional task performance skills with no increase in pain or tingling afterwards. However, patient does continue to experience tingling in her R UE of the same tigling and frequency as before, and does continue to ahve one sided ROM deficitsas welll; however, despite these deficits patient reports she is very happy with her progress and would like to DC today. Spent rest of session focusing on development of advanced HEP for home use.    Rehab Potential Good   PT Frequency Other (comment)  DC today    PT Duration Other (comment)  Dc today    PT Treatment/Interventions ADLs/Self Care Home Management;Biofeedback;Cryotherapy;Moist Heat;Therapeutic activities;Therapeutic exercise;Balance training;Neuromuscular re-education;Patient/family education;Manual techniques;Passive range of motion;Energy conservation;Taping   PT Next Visit Plan DC today    PT Home Exercise Plan advanced HEP    Consulted and Agree with Plan of Care Patient      Patient will benefit from skilled therapeutic intervention in order to improve the following deficits and impairments:  Hypomobility, Pain, Increased muscle spasms, Decreased range of motion, Improper body mechanics, Postural dysfunction  Visit Diagnosis: Abnormal posture  Cervicalgia  Pain In Right Arm     Problem List Patient Active Problem List   Diagnosis Date Noted  . Arthralgia of acromioclavicular joint 05/25/2014  . GERD (gastroesophageal reflux disease) 05/08/2013  . Elevated liver enzymes 05/08/2013  . Abdominal pain 03/25/2013  . Rotator cuff syndrome of left shoulder 07/31/2011   PHYSICAL THERAPY DISCHARGE SUMMARY  Visits from Start of Care: 4  Current functional level related to goals /  functional outcomes: Pt doing well overall, significant reduction in symptoms, last complaints include numbness and tingling.  She is requesting d/c today.   Remaining deficits: Cervical stiffness, impaired sensation, muscle weakness.   Education / Equipment: Advanced HEP Plan: Patient agrees to discharge.  Patient goals were partially met. Patient is being discharged due to the patient's request.  ?????       Deniece Ree PT, DPT [place], [place], [address] Phone: [phone]   Fax:  [phone]  Name: Cheryl Gutierrez MRN: [id number] Date of Birth: Dec 14, 1955

## 2015-12-06 NOTE — Patient Instructions (Addendum)
THERABAND ROWS Close the knotted end of the Theraband in the side of a door. Grasp the looped end of the band with both hands. Pull the hands back toward the armpits, drawing the shoulder blades tightly together. Slowly return the hands to the starting position. Repeat. Repeat 10 Times Hold 2 Seconds Complete 2 Sets Perform 3 Time(s) a Day    Shoulder Extension with Theraband Red theraband. Standing with feet shoulder width apart, stomach drawn in and glut muscles tight. Start holding theraband in both hands, arms extended in front of you. Squeeze shoulder blades down and back then pull your hands to your sides keeping your arms straight. Powered Repeat 10 Times Hold 2 Seconds Complete 2 Sets Perform 3 Time(s) a Day    Horizontal Shoulder Abduction Using Green theraband. Start with arms shoulder width apart. Pull arms apart, keep arms in line with Shoulders Repeat 10 Times Hold 2 Seconds Complete 2 Sets Perform 3 Time(s) a Day    CORNER STRETCH While standing at a corner of a wall, place your arms on the walls with elobws bent so that your upper arms are horizontal and your forearms are directed upwards as shown. Take one step forward towards the corner. Bend your front knee until a stretch is felt along the front of your chest and/or shoulders. Your arms should be pointed downward towards the ground. NOTE: Your legs should control the stretch Repeat 2 Times Hold 30 Seconds Complete 2 Sets Perform 3 Time(s) a Day    WALL PUSH UPS Standing at a wall, place your arms out in front of you with your elbows Sable Feil so that your hands just reach the wall. Next, bend your elbows slowly to bring your chest closer to the wall. Maintain your feet planted on the ground the entire time. Repeat 10 Times Hold 2 Seconds Complete 2 Sets Perform 3 Time(s) a Day

## 2015-12-20 ENCOUNTER — Ambulatory Visit: Payer: 59 | Admitting: Orthopedic Surgery

## 2016-01-17 DIAGNOSIS — Z79899 Other long term (current) drug therapy: Secondary | ICD-10-CM | POA: Diagnosis not present

## 2016-01-17 DIAGNOSIS — F329 Major depressive disorder, single episode, unspecified: Secondary | ICD-10-CM | POA: Diagnosis not present

## 2016-01-17 DIAGNOSIS — F419 Anxiety disorder, unspecified: Secondary | ICD-10-CM | POA: Diagnosis not present

## 2016-01-17 DIAGNOSIS — E119 Type 2 diabetes mellitus without complications: Secondary | ICD-10-CM | POA: Diagnosis not present

## 2016-01-17 DIAGNOSIS — L659 Nonscarring hair loss, unspecified: Secondary | ICD-10-CM | POA: Diagnosis not present

## 2016-01-17 DIAGNOSIS — R945 Abnormal results of liver function studies: Secondary | ICD-10-CM | POA: Diagnosis not present

## 2016-01-17 DIAGNOSIS — E785 Hyperlipidemia, unspecified: Secondary | ICD-10-CM | POA: Diagnosis not present

## 2016-01-21 DIAGNOSIS — G4733 Obstructive sleep apnea (adult) (pediatric): Secondary | ICD-10-CM | POA: Diagnosis not present

## 2016-01-24 DIAGNOSIS — E785 Hyperlipidemia, unspecified: Secondary | ICD-10-CM | POA: Diagnosis not present

## 2016-01-24 DIAGNOSIS — F331 Major depressive disorder, recurrent, moderate: Secondary | ICD-10-CM | POA: Diagnosis not present

## 2016-01-24 DIAGNOSIS — E1129 Type 2 diabetes mellitus with other diabetic kidney complication: Secondary | ICD-10-CM | POA: Diagnosis not present

## 2016-01-24 MED FILL — BUPROPION HCL XL 150 MG TAB: 150 | 90 days supply | Qty: 90 | Fill #0

## 2016-02-10 MED FILL — PARoxetine HCL 40 MG TABS: 40 | 90 days supply | Qty: 90 | Fill #2

## 2016-03-23 DIAGNOSIS — F3340 Major depressive disorder, recurrent, in remission, unspecified: Secondary | ICD-10-CM | POA: Diagnosis not present

## 2016-03-23 DIAGNOSIS — E119 Type 2 diabetes mellitus without complications: Secondary | ICD-10-CM | POA: Diagnosis not present

## 2016-03-30 ENCOUNTER — Emergency Department (HOSPITAL_COMMUNITY)
Admission: EM | Admit: 2016-03-30 | Discharge: 2016-03-30 | Disposition: A | Discharge status: Home / Self Care | Payer: 59 | Attending: Emergency Medicine | Admitting: Emergency Medicine

## 2016-03-30 ENCOUNTER — Emergency Department (HOSPITAL_COMMUNITY): Payer: 59

## 2016-03-30 ENCOUNTER — Encounter (HOSPITAL_COMMUNITY): Payer: Self-pay | Admitting: *Deleted

## 2016-03-30 DIAGNOSIS — R51 Headache: Secondary | ICD-10-CM | POA: Diagnosis not present

## 2016-03-30 DIAGNOSIS — R519 Headache, unspecified: Secondary | ICD-10-CM

## 2016-03-30 DIAGNOSIS — Z79899 Other long term (current) drug therapy: Secondary | ICD-10-CM | POA: Diagnosis not present

## 2016-03-30 DIAGNOSIS — E119 Type 2 diabetes mellitus without complications: Secondary | ICD-10-CM | POA: Insufficient documentation

## 2016-03-30 MED ORDER — METOCLOPRAMIDE HCL 5 MG/ML IJ SOLN
10.0000 mg | Freq: Once | INTRAMUSCULAR | Status: AC
  Administered 2016-03-30: 10 mg via INTRAVENOUS
  Filled 2016-03-30: qty 2

## 2016-03-30 MED ORDER — SODIUM CHLORIDE 0.9 % IV BOLUS (SEPSIS)
1000.0000 mL | Freq: Once | INTRAVENOUS | Status: AC
  Administered 2016-03-30: 1000 mL via INTRAVENOUS

## 2016-03-30 MED ORDER — ACETAMINOPHEN 500 MG PO TABS
1000.0000 mg | ORAL_TABLET | Freq: Once | ORAL | Status: AC
  Administered 2016-03-30: 1000 mg via ORAL
  Filled 2016-03-30: qty 2

## 2016-03-30 NOTE — ED Triage Notes (Signed)
Pt reports onset yesterday of severe headache. This am having n/v. Has mild sensitivity to light. Hx of migraines but it was long time ago.

## 2016-03-30 NOTE — ED Provider Notes (Signed)
[place] DEPT Provider Note   CSN: [id number] Arrival date & time: 03/30/16  0730     History   Chief Complaint Chief Complaint  Patient presents with  . Headache  . Emesis    HPI Cheryl Gutierrez is a 60 y.o. female.  HPI  This patient has a history of diet-controlled diabetes, prior migraines, presents for headache. This started yesterday while she was at work. It is been waxing and waning since onset. This worsened by light, improved with ice on her forehead. She reports taking hydrocodone yesterday which seemed to make headache worse. She states the pain is frontal, 10 out of 10 right now, nonradiating. Denies any numbness weakness of extremities. She does state this is the worst headache she's had, doesn't feel similar to prior migraines. Denies recent fevers, chills, shortness of breath, abdominal pain, urinary symptoms. She does report an episode of emesis this morning.  Past Medical History:  Diagnosis Date  . Diabetes mellitus without complication ([place])    Controlled with diet  . Hiatal hernia   . Hyperplastic colon polyp   . Reflux esophagitis    erosive    Patient Active Problem List   Diagnosis Date Noted  . Arthralgia of acromioclavicular joint 05/25/2014  . GERD (gastroesophageal reflux disease) 05/08/2013  . Elevated liver enzymes 05/08/2013  . Abdominal pain 03/25/2013  . Rotator cuff syndrome of left shoulder 07/31/2011    Past Surgical History:  Procedure Laterality Date  . ABDOMINAL HYSTERECTOMY    . COLONOSCOPY  07/29/2007   Dr. Gala Romney- anal papillae, o/w normal rectum, pedunculated polyp,mic sigmoid colon, hyperplastic polyp on bx, the remainder of the colonic mucosa appeared normal  . ESOPHAGOGASTRODUODENOSCOPY N/A 03/31/2013   Dr. Raliegh Scarlet reflux esophagitis, hiatal hernia, gastric erosions, gastric polyp.bx= mild chronic inactive gastritis, fundic gland polyp    OB History    No data available       Home Medications    Prior  to Admission medications   Medication Sig Start Date End Date Taking? Authorizing Provider  cholecalciferol (VITAMIN D) 1000 UNITS tablet Take 1,000 Units by mouth daily. Reported on 11/16/2015    Historical Provider, MD  diclofenac (CATAFLAM) 50 MG tablet Take 1 tablet (50 mg total) by mouth 2 (two) times daily. 11/05/15   Carole Civil, MD  HYDROcodone-acetaminophen (NORCO/VICODIN) 5-325 MG per tablet Take 1 tablet by mouth every 6 (six) hours as needed for moderate pain. 09/10/14   Carole Civil, MD  PARoxetine (PAXIL) 40 MG tablet Take 40 mg by mouth every morning.      Historical Provider, MD    Family History Family History  Problem Relation Age of Onset  . Anesthesia problems Neg Hx   . Broken bones Neg Hx   . Cancer Neg Hx   . Clotting disorder Neg Hx   . Collagen disease Neg Hx   . Diabetes Neg Hx   . Dislocations Neg Hx   . Osteoporosis Neg Hx   . Rheumatologic disease Neg Hx   . Scoliosis Neg Hx   . Severe sprains Neg Hx     Social History Social History  Substance Use Topics  . Smoking status: Never Smoker  . Smokeless tobacco: Not on file  . Alcohol use No     Allergies   Review of patient's allergies indicates no known allergies.   Review of Systems Review of Systems  Constitutional: Negative for chills and fever.  HENT: Negative for ear pain and sore throat.  Eyes: Negative for pain and visual disturbance.  Respiratory: Negative for cough and shortness of breath.   Cardiovascular: Negative for chest pain and palpitations.  Gastrointestinal: Positive for nausea and vomiting. Negative for abdominal pain.  Genitourinary: Negative for dysuria and hematuria.  Musculoskeletal: Negative for arthralgias and back pain.  Skin: Negative for color change and rash.  Neurological: Positive for headaches. Negative for seizures and syncope.  All other systems reviewed and are negative.    Physical Exam Updated Vital Signs BP 131/98   Pulse 92   Temp 98.8  F (37.1 C) (Oral)   Resp 18   Ht 5\' 4"  (1.626 m)   Wt 79.4 kg   SpO2 99%   BMI 30.04 kg/m   Physical Exam  Constitutional: She appears well-developed and well-nourished. No distress.  HENT:  Head: Normocephalic and atraumatic.  Eyes: Conjunctivae are normal.  Neck: Neck supple.  Cardiovascular: Normal rate and regular rhythm.   No murmur heard. Pulmonary/Chest: Effort normal and breath sounds normal. No respiratory distress.  Abdominal: Soft. There is no tenderness.  Musculoskeletal: She exhibits no edema.  Neurological: She is alert.  Alert and oriented CN II-XII grossly intact Eyes: PERRL, EOMI Bilateral UE strength 5/5 Bilateral LE strength 5/5 Intact finger-to-nose  Skin: Skin is warm and dry.  Psychiatric: She has a normal mood and affect.  Nursing note and vitals reviewed.    ED Treatments / Results  Labs (all labs ordered are listed, but only abnormal results are displayed) Labs Reviewed - No data to display  EKG  EKG Interpretation None       Radiology Ct Head Wo Contrast  Result Date: 03/30/2016 CLINICAL DATA:  Acute onset severe headache with nausea and vomiting. EXAM: CT HEAD WITHOUT CONTRAST TECHNIQUE: Contiguous axial images were obtained from the base of the skull through the vertex without intravenous contrast. COMPARISON:  None. FINDINGS: Brain: The ventricles are normal in size and configuration. There is no evident mass, hemorrhage, extra-axial fluid collection, or midline shift. Gray-white compartments appear within normal limits. No acute infarct evident. Vascular: There is no hyperdense vessel evident. There is calcification in the distal left vertebral artery. There are foci of calcification distal left carotid siphon. Skull: The bony calvarium appears intact. Sinuses/Orbits: Visualized paranasal sinuses are clear. Orbits appear symmetric bilaterally. Other: Visualized mastoid air cells are clear. IMPRESSION: No intracranial mass, hemorrhage, or  focal gray - white compartment lesion. There are foci of arterial vascular calcification. Electronically Signed   By: Lowella Grip III M.D.   On: 03/30/2016 11:21    Procedures Procedures (including critical care time)  Medications Ordered in ED Medications  metoCLOPramide (REGLAN) injection 10 mg (10 mg Intravenous Given 03/30/16 0957)  acetaminophen (TYLENOL) tablet 1,000 mg (1,000 mg Oral Given 03/30/16 0958)  sodium chloride 0.9 % bolus 1,000 mL (0 mLs Intravenous Stopped 03/30/16 1051)     Initial Impression / Assessment and Plan / ED Course  I have reviewed the triage vital signs and the nursing notes.  Pertinent labs & imaging results that were available during my care of the patient were reviewed by me and considered in my medical decision making (see chart for details).  Clinical Course    CT head performed given difference of this migraine to priors and showed NAICA.  Would doubt meningitis (no fever, chills, meningismus, recent illness, non-focal exam).  Would doubt ICH (negative Ct, and pain improved with treatment).  Pain resolved with migraine cocktail.  Impression is primary headache.  Final Clinical Impressions(s) / ED Diagnoses   Final diagnoses:  Acute nonintractable headache, unspecified headache type    New Prescriptions New Prescriptions   No medications on file     Levada Schilling, MD 03/30/16 [place], MD 03/30/16 [phone]

## 2016-03-30 NOTE — ED Notes (Signed)
Patient transported to CT 

## 2016-04-21 MED FILL — BUPROPION HCL XL 150 MG TAB: 150 | 90 days supply | Qty: 90 | Fill #1

## 2016-05-24 DIAGNOSIS — E119 Type 2 diabetes mellitus without complications: Secondary | ICD-10-CM | POA: Diagnosis not present

## 2016-06-01 DIAGNOSIS — E119 Type 2 diabetes mellitus without complications: Secondary | ICD-10-CM | POA: Diagnosis not present

## 2016-06-01 DIAGNOSIS — F3340 Major depressive disorder, recurrent, in remission, unspecified: Secondary | ICD-10-CM | POA: Diagnosis not present

## 2016-07-07 DIAGNOSIS — H524 Presbyopia: Secondary | ICD-10-CM | POA: Diagnosis not present

## 2016-07-07 DIAGNOSIS — H52223 Regular astigmatism, bilateral: Secondary | ICD-10-CM | POA: Diagnosis not present

## 2016-07-07 DIAGNOSIS — H04123 Dry eye syndrome of bilateral lacrimal glands: Secondary | ICD-10-CM | POA: Diagnosis not present

## 2016-07-07 DIAGNOSIS — H5213 Myopia, bilateral: Secondary | ICD-10-CM | POA: Diagnosis not present

## 2016-07-14 MED FILL — PARoxetine HCL 40 MG TABS: 40 | 90 days supply | Qty: 90 | Fill #0

## 2016-08-15 MED FILL — BUPROPION HCL XL 150 MG TAB: 150 | 90 days supply | Qty: 90 | Fill #0

## 2016-08-21 MED FILL — TRIAZOLAM 0.25 MG TABLET: 0.25 | 3 days supply | Qty: 6 | Fill #0

## 2016-09-12 ENCOUNTER — Other Ambulatory Visit (HOSPITAL_COMMUNITY): Payer: Self-pay | Admitting: Internal Medicine

## 2016-09-12 DIAGNOSIS — Z1231 Encounter for screening mammogram for malignant neoplasm of breast: Secondary | ICD-10-CM

## 2016-09-28 DIAGNOSIS — E119 Type 2 diabetes mellitus without complications: Secondary | ICD-10-CM | POA: Diagnosis not present

## 2016-10-05 DIAGNOSIS — E119 Type 2 diabetes mellitus without complications: Secondary | ICD-10-CM | POA: Diagnosis not present

## 2016-10-05 DIAGNOSIS — F3340 Major depressive disorder, recurrent, in remission, unspecified: Secondary | ICD-10-CM | POA: Diagnosis not present

## 2016-10-05 DIAGNOSIS — Z6828 Body mass index (BMI) 28.0-28.9, adult: Secondary | ICD-10-CM | POA: Diagnosis not present

## 2016-10-12 ENCOUNTER — Ambulatory Visit (HOSPITAL_COMMUNITY)
Admission: RE | Admit: 2016-10-12 | Discharge: 2016-10-12 | Disposition: A | Discharge status: Home / Self Care | Payer: 59 | Source: Ambulatory Visit | Attending: Internal Medicine | Admitting: Internal Medicine

## 2016-10-12 DIAGNOSIS — Z1231 Encounter for screening mammogram for malignant neoplasm of breast: Secondary | ICD-10-CM | POA: Insufficient documentation

## 2016-10-23 MED FILL — PARoxetine HCL 40 MG TABS: 40 | 90 days supply | Qty: 90 | Fill #1

## 2017-01-19 MED FILL — BUPROPION HCL XL 150 MG TAB: 150 | 90 days supply | Qty: 90 | Fill #0

## 2017-01-19 MED FILL — PARoxetine HCL 40 MG TABS: 40 | 90 days supply | Qty: 90 | Fill #2

## 2017-01-24 DIAGNOSIS — E119 Type 2 diabetes mellitus without complications: Secondary | ICD-10-CM | POA: Diagnosis not present

## 2017-01-24 DIAGNOSIS — Z79899 Other long term (current) drug therapy: Secondary | ICD-10-CM | POA: Diagnosis not present

## 2017-01-24 DIAGNOSIS — E785 Hyperlipidemia, unspecified: Secondary | ICD-10-CM | POA: Diagnosis not present

## 2017-01-31 DIAGNOSIS — F329 Major depressive disorder, single episode, unspecified: Secondary | ICD-10-CM | POA: Diagnosis not present

## 2017-01-31 DIAGNOSIS — E785 Hyperlipidemia, unspecified: Secondary | ICD-10-CM | POA: Diagnosis not present

## 2017-01-31 DIAGNOSIS — E119 Type 2 diabetes mellitus without complications: Secondary | ICD-10-CM | POA: Diagnosis not present

## 2017-01-31 MED FILL — ATORVASTATIN 20 MG TABLET: 20 | 90 days supply | Qty: 90 | Fill #0

## 2017-05-02 DIAGNOSIS — E119 Type 2 diabetes mellitus without complications: Secondary | ICD-10-CM | POA: Diagnosis not present

## 2017-05-02 DIAGNOSIS — Z79899 Other long term (current) drug therapy: Secondary | ICD-10-CM | POA: Diagnosis not present

## 2017-05-02 DIAGNOSIS — E785 Hyperlipidemia, unspecified: Secondary | ICD-10-CM | POA: Diagnosis not present

## 2017-05-02 DIAGNOSIS — F329 Major depressive disorder, single episode, unspecified: Secondary | ICD-10-CM | POA: Diagnosis not present

## 2017-05-09 DIAGNOSIS — E119 Type 2 diabetes mellitus without complications: Secondary | ICD-10-CM | POA: Diagnosis not present

## 2017-05-09 DIAGNOSIS — E785 Hyperlipidemia, unspecified: Secondary | ICD-10-CM | POA: Diagnosis not present

## 2017-05-09 DIAGNOSIS — S51811A Laceration without foreign body of right forearm, initial encounter: Secondary | ICD-10-CM | POA: Diagnosis not present

## 2017-05-17 MED FILL — ATORVASTATIN 20 MG TABLET: 20 | 90 days supply | Qty: 90 | Fill #1

## 2017-05-17 MED FILL — BUPROPION HCL XL 150 MG TAB: 150 | 90 days supply | Qty: 90 | Fill #1

## 2017-05-17 MED FILL — PARoxetine HCL 40 MG TABS: 40 | 90 days supply | Qty: 90 | Fill #3

## 2017-06-05 ENCOUNTER — Ambulatory Visit (INDEPENDENT_AMBULATORY_CARE_PROVIDER_SITE_OTHER): Payer: 59 | Admitting: Orthopedic Surgery

## 2017-06-05 ENCOUNTER — Ambulatory Visit (INDEPENDENT_AMBULATORY_CARE_PROVIDER_SITE_OTHER): Payer: 59

## 2017-06-05 DIAGNOSIS — M25511 Pain in right shoulder: Secondary | ICD-10-CM

## 2017-06-05 DIAGNOSIS — G8929 Other chronic pain: Secondary | ICD-10-CM | POA: Diagnosis not present

## 2017-06-05 DIAGNOSIS — M7521 Bicipital tendinitis, right shoulder: Secondary | ICD-10-CM

## 2017-06-05 MED ORDER — METHYLPREDNISOLONE ACETATE 40 MG/ML IJ SUSP
40.0000 mg | Freq: Once | INTRAMUSCULAR | Status: AC
  Administered 2017-06-05: 40 mg via INTRA_ARTICULAR

## 2017-06-05 NOTE — Progress Notes (Signed)
Progress Note   Patient ID: Cheryl Gutierrez, female   DOB: [birth year], 61 y.o.   MRN: [id number]  Chief Complaint  Patient presents with  . Shoulder Pain    right     Cheryl Gutierrez presents today with pain in her right shoulder. The pain is in the front of the shoulder. She's had it for several months now. She notices that when she pulls on the lawn mower to start it hurts even more. She is having some night pain. Most of the pain is dull aching in the front of the arm it's from the acromion to the elbow not below and not proximal she does have some cervical spondylosis but doesn't think it's related to that     Review of Systems  Musculoskeletal: Positive for neck pain.  Skin: Negative.   Neurological: Negative for tingling.   Current Meds  Medication Sig  . atorvastatin (LIPITOR) 20 MG tablet Take 20 mg by mouth daily.  Marland Kitchen buPROPion (WELLBUTRIN XL) 150 MG 24 hr tablet Take 150 mg by mouth daily.  . cholecalciferol (VITAMIN D) 1000 UNITS tablet Take 1,000 Units by mouth daily. Reported on 11/16/2015  . PARoxetine (PAXIL) 40 MG tablet Take 40 mg by mouth every morning.      Past Medical History:  Diagnosis Date  . Diabetes mellitus without complication ([place])    Controlled with diet  . Hiatal hernia   . Hyperplastic colon polyp   . Reflux esophagitis    erosive     No Known Allergies  There were no vitals taken for this visit.   Physical Exam Gen. appearance the patient's appearance is normal with normal grooming and  hygiene The patient is oriented to person place and time Mood and affect are normal   Ortho Exam  Right shoulder. Tenderness in the biceps muscle and tendon and the rotator interval she has full range of motion of the shoulder. The shoulder stable in abduction external rotation no apprehension she has a negative Neer sign for impingement rotator cuff strength is normal in all planes. Skin without rash or nodularity. Normal sensation is evaluated in the hand good  pulse and capillary refill no epitrochlear lymph nodes are positive  The left shoulder range of motion is normal  Medical decision-making Encounter Diagnoses  Name Primary?  . Chronic right shoulder pain   . Biceps tendonitis on right Yes    Plain films of the shoulder are normal please see my dictated report  Meds ordered this encounter  Medications  . atorvastatin (LIPITOR) 20 MG tablet    Sig: Take 20 mg by mouth daily.    Refill:  4  . buPROPion (WELLBUTRIN XL) 150 MG 24 hr tablet    Sig: Take 150 mg by mouth daily.    Refill:  1   Biceps tendon injection Right biceps tendon was injected The patient gave verbal consent for cortisone injection Timeout confirmed the site of injection Medications used included 40 mg of Depo-Medrol and 3 mL 1% lidocaine After alcohol and ethyl chloride preparation the point of maximal tenderness was injected over the right biceps tendon there were no complications  Arther Abbott, MD 06/05/2017 11:05 AM

## 2017-06-05 NOTE — Patient Instructions (Addendum)
You have received an injection of steroids into the joint. 15% of patients will have increased pain within the 24 hours postinjection.   This is transient and will go away.   We recommend that you use ice packs on the injection site for 20 minutes every 2 hours and extra strength Tylenol 2 tablets every 8 as needed until the pain resolves.  If you continue to have pain after taking the Tylenol and using the ice please call the office for further instructions.   Ice the front of the arm 30 min every day   Take diclofenac

## 2017-06-28 ENCOUNTER — Encounter: Payer: Self-pay | Admitting: Internal Medicine

## 2017-08-14 HISTORY — PX: SHOULDER SURGERY: SHX246

## 2017-09-13 ENCOUNTER — Ambulatory Visit (INDEPENDENT_AMBULATORY_CARE_PROVIDER_SITE_OTHER): Payer: BLUE CROSS/BLUE SHIELD | Admitting: Orthopaedic Surgery

## 2017-09-13 ENCOUNTER — Encounter (INDEPENDENT_AMBULATORY_CARE_PROVIDER_SITE_OTHER): Payer: Self-pay | Admitting: Orthopaedic Surgery

## 2017-09-13 VITALS — BP 115/77 | HR 84 | Ht 64.0 in | Wt 170.0 lb

## 2017-09-13 DIAGNOSIS — G8929 Other chronic pain: Secondary | ICD-10-CM | POA: Diagnosis not present

## 2017-09-13 DIAGNOSIS — M75102 Unspecified rotator cuff tear or rupture of left shoulder, not specified as traumatic: Secondary | ICD-10-CM

## 2017-09-13 DIAGNOSIS — M25511 Pain in right shoulder: Secondary | ICD-10-CM

## 2017-09-13 NOTE — Addendum Note (Signed)
Addended by: Meyer Cory on: 09/13/2017 02:34 PM   Modules accepted: Orders

## 2017-09-13 NOTE — Progress Notes (Signed)
Office Visit Note   Patient: Cheryl Gutierrez           Date of Birth: [birth year]           MRN: [id number] Visit Date: 09/13/2017              Requested by: Asencion Noble, MD [address] [place], [place] [address] PCP: Asencion Noble, MD   Assessment & Plan: Visit Diagnoses:  1. Rotator cuff syndrome of left shoulder     Plan: Patient's had symptoms for 2 years she is failed conservative treatment she has had 3 injections.  Her exam is most consistent with biceps tendon problem with anterior shoulder pain she may have a SLAP tear or partial biceps tendon tear.  Office follow-up after MRI scan for review.  Follow-Up Instructions: No Follow-up on file.   Orders:  No orders of the defined types were placed in this encounter.  No orders of the defined types were placed in this encounter.     Procedures: No procedures performed   Clinical Data: No additional findings.   Subjective: Chief Complaint  Patient presents with  . Right Shoulder - Pain    HPI 62 year old female with persistent problems with her right shoulder for greater than 2 years.  She is had 3 injections the last anteriorly over the biceps tendon and has persistent problems washing her hair getting dressed.  She can only reach the posterior axillary line acute anterior shoulder pain.  No problems with her opposite shoulder.  No past history of injury to her shoulder.  She denies associated neck pain.  Previous C-spine x-rays showed mild disc space narrowing with normal curvature.  She denies any radicular symptoms into her hands.  Patient is retired but used to work at [employer].  Patient used heat anti-inflammatories and ibuprofen without relief.  Recurrence of symptoms short time after the injections.  Review of Systems positive for GERD, elevated liver enzymes.  Right shoulder pain times 2 years.  Hydrocodone and anti-inflammatories have been prescribed.   Objective: Vital  Signs: BP 115/77   Pulse 84   Ht 5\' 4"  (1.626 m)   Wt 170 lb (77.1 kg)   BMI 29.18 kg/m   Physical Exam  Constitutional: She is oriented to person, place, and time. She appears well-developed.  HENT:  Head: Normocephalic.  Right Ear: External ear normal.  Left Ear: External ear normal.  Eyes: Pupils are equal, round, and reactive to light.  Neck: No tracheal deviation present. No thyromegaly present.  Cardiovascular: Normal rate.  Pulmonary/Chest: Effort normal.  Abdominal: Soft.  Neurological: She is alert and oriented to person, place, and time.  Skin: Skin is warm and dry.  Psychiatric: She has a normal mood and affect. Her behavior is normal.    Ortho Exam patient can get her hand to the top of her head.  Internal rotation hand limited to posterior axillary line.  Positive Hawkins negative Neer negative drop arm test but she does have some pain with resisted supraspinatus testing.  No subscap weakness.  Unable to do liftoff test due to limited internal rotation.  AC joint is mildly tender.  No brachial plexus tenderness no supraclavicular lymphadenopathy no increased pain with cervical compression no change with distraction.  Fixation hand is intact no rash over exposed skin.  His pain with biceps contracture and resisted testing.  Specialty Comments:  No specialty comments available.  Imaging: No results found.   PMFS History:  Patient Active Problem List   Diagnosis Date Noted  . Arthralgia of acromioclavicular joint 05/25/2014  . GERD (gastroesophageal reflux disease) 05/08/2013  . Elevated liver enzymes 05/08/2013  . Abdominal pain 03/25/2013  . Rotator cuff syndrome of left shoulder 07/31/2011   Past Medical History:  Diagnosis Date  . Diabetes mellitus without complication ([place])    Controlled with diet  . Hiatal hernia   . Hyperplastic colon polyp   . Reflux esophagitis    erosive    Family History  Problem Relation Age of Onset  . Anesthesia problems Neg  Hx   . Broken bones Neg Hx   . Cancer Neg Hx   . Clotting disorder Neg Hx   . Collagen disease Neg Hx   . Diabetes Neg Hx   . Dislocations Neg Hx   . Osteoporosis Neg Hx   . Rheumatologic disease Neg Hx   . Scoliosis Neg Hx   . Severe sprains Neg Hx     Past Surgical History:  Procedure Laterality Date  . ABDOMINAL HYSTERECTOMY    . COLONOSCOPY  07/29/2007   Dr. Gala Romney- anal papillae, o/w normal rectum, pedunculated polyp,mic sigmoid colon, hyperplastic polyp on bx, the remainder of the colonic mucosa appeared normal  . ESOPHAGOGASTRODUODENOSCOPY N/A 03/31/2013   Dr. Raliegh Scarlet reflux esophagitis, hiatal hernia, gastric erosions, gastric polyp.bx= mild chronic inactive gastritis, fundic gland polyp   Social History   Occupational History  . Occupation: clerical  Tobacco Use  . Smoking status: Never Smoker  . Smokeless tobacco: Never Used  Substance and Sexual Activity  . Alcohol use: No  . Drug use: No  . Sexual activity: Not on file

## 2017-09-20 ENCOUNTER — Encounter (INDEPENDENT_AMBULATORY_CARE_PROVIDER_SITE_OTHER): Payer: Self-pay | Admitting: Orthopaedic Surgery

## 2017-09-20 ENCOUNTER — Encounter: Payer: Self-pay | Admitting: Orthopaedic Surgery

## 2017-09-20 ENCOUNTER — Ambulatory Visit (INDEPENDENT_AMBULATORY_CARE_PROVIDER_SITE_OTHER): Payer: BLUE CROSS/BLUE SHIELD | Admitting: Orthopaedic Surgery

## 2017-09-20 VITALS — BP 126/73 | HR 99 | Ht 64.0 in | Wt 170.0 lb

## 2017-09-20 DIAGNOSIS — M75102 Unspecified rotator cuff tear or rupture of left shoulder, not specified as traumatic: Secondary | ICD-10-CM | POA: Diagnosis not present

## 2017-09-20 DIAGNOSIS — M67912 Unspecified disorder of synovium and tendon, left shoulder: Secondary | ICD-10-CM | POA: Diagnosis not present

## 2017-09-20 DIAGNOSIS — M67814 Other specified disorders of tendon, left shoulder: Secondary | ICD-10-CM

## 2017-09-24 ENCOUNTER — Telehealth (INDEPENDENT_AMBULATORY_CARE_PROVIDER_SITE_OTHER): Payer: Self-pay | Admitting: Orthopaedic Surgery

## 2017-09-24 NOTE — Telephone Encounter (Signed)
Patient calling to schedule surgery for her shoulder, but I don't have a surgery sheet.  She was seen in [facility] 09/20/17.

## 2017-09-24 NOTE — Telephone Encounter (Signed)
Can you please advise on surgery patient is going to be scheduled for?  I did not have a copy of the blue sheet.

## 2017-09-26 ENCOUNTER — Telehealth (INDEPENDENT_AMBULATORY_CARE_PROVIDER_SITE_OTHER): Payer: Self-pay | Admitting: Orthopaedic Surgery

## 2017-09-26 ENCOUNTER — Encounter (INDEPENDENT_AMBULATORY_CARE_PROVIDER_SITE_OTHER): Payer: Self-pay | Admitting: Orthopaedic Surgery

## 2017-09-26 DIAGNOSIS — M67814 Other specified disorders of tendon, left shoulder: Secondary | ICD-10-CM | POA: Insufficient documentation

## 2017-09-26 NOTE — Telephone Encounter (Signed)
Blue sheet to [facility] [employer]

## 2017-09-26 NOTE — Telephone Encounter (Signed)
Patient called wanting to speak with you about her options for surgery. CB # [phone]

## 2017-09-26 NOTE — Progress Notes (Signed)
Office Visit Note   Patient: Cheryl Gutierrez           Date of Birth: 06/29/1956           MRN: [id number] Visit Date: 09/20/2017              Requested by: Asencion Noble, MD [address] [place], [place] [address] PCP: Asencion Noble, MD   Assessment & Plan: Visit Diagnoses:  1. Rotator cuff syndrome of left shoulder   2. Biceps tendinosis of left shoulder     Plan: Patient is failed therapy for her shoulders had 3 injections.  MRI scan shows tendinopathy long head of the biceps which is her primary pain generator.  Is fissuring of the rotator cuff but no full-thickness tear.  We discussed options for treatment and we also discussed concern over continued use of hydrocodone for the pain that she has.  She could consider outpatient shoulder arthroscopy with possible rotator cuff repair and will likely need biceps tenodesis due to her biceps tendinopathy.  She did not have superior labral tearing but did not have an arthrogram MRI study and she may have some superior labral tearing but this would be taken care of by the biceps tenodesis.  This would be outpatient surgery if she decides to proceed.  She can call if she like to proceed.  We discussed postoperative use of a sling and exercise program possibly some physical therapy based on intraoperative findings.  Pathophysiology of her condition discussed.  I gave her a copy of her MRI report.'s were elicited and answered.  Follow-Up Instructions: No Follow-up on file.   Orders:  No orders of the defined types were placed in this encounter.  No orders of the defined types were placed in this encounter.     Procedures: No procedures performed   Clinical Data: No additional findings.   Subjective: Chief Complaint  Patient presents with  . Right Shoulder - Pain, Follow-up    MRI review    HPI 62 year old female with greater than 2-year history of persistent left shoulder pain with outstretched reaching and overhead  activities.  Bothers her with daily activities dressing fixing her hair.  She has a large amount of anterior shoulder pain with motion.  She has had 3 previous injections without relief.  She denies fever chills mild amount of cervical spine pain with mild narrowing on plain radiographs previously obtained.  She is failed anti-inflammatory treatments and prednisone pack.  She is also been on hydrocodone.  Review of Systems positive for GERD.  Past elevated liver enzymes.  Persistent right shoulder pain times greater than 2 years.  14 point review of systems otherwise negative as it pertains HPI.  She denies fever chills no bowel or bladder symptoms no lower extremity weakness no gait disturbance.   Objective: Vital Signs: BP 126/73   Pulse 99   Ht 5\' 4"  (1.626 m)   Wt 170 lb (77.1 kg)   BMI 29.18 kg/m   Physical Exam  Constitutional: She is oriented to person, place, and time. She appears well-developed.  HENT:  Head: Normocephalic.  Right Ear: External ear normal.  Left Ear: External ear normal.  Eyes: Pupils are equal, round, and reactive to light.  Neck: No tracheal deviation present. No thyromegaly present.  Cardiovascular: Normal rate.  Pulmonary/Chest: Effort normal.  Abdominal: Soft.  Neurological: She is alert and oriented to person, place, and time.  Skin: Skin is warm and dry.  Psychiatric: She has a  normal mood and affect. Her behavior is normal.    Ortho Exam patient has good shoulder flexion arm up overhead with pain.  Positive Hawkins test anteriorly on the left.  Negative on the right.  Negative Neer negative drop arm test but she does have pain.  No infraspinatus teres or subscap weakness.  She has some limitation of internal rotation due to pain anteriorly over the biceps tendon.  No supraclavicular lymphadenopathy.  Upper extremity reflexes are 2+ mild brachial plexus tenderness.  No lower extremity clonus.  Normal gait.  Biceps triceps wrist flexion extension finger  flexion extension interossei are normal.  Specialty Comments:  No specialty comments available.  Imaging: MRI right shoulder diveemonstrates significant rotator cuff tendinopathy with extensive thickness retracted tear is noted.  No muscle atrophy.  Small ganglion at the infraspinatus anterior edge 0.4 cm x 0.3 cm.  There is intrasubstance biceps long head tendinopathy without complete tear.  Acromioclavicular degenerative changes type I acromium.  Fluid in the subacromial subdeltoid bursa.  No glenohumeral arthritis.   PMFS History: Patient Active Problem List   Diagnosis Date Noted  . Biceps tendinosis of left shoulder 09/26/2017  . Arthralgia of acromioclavicular joint 05/25/2014  . GERD (gastroesophageal reflux disease) 05/08/2013  . Elevated liver enzymes 05/08/2013  . Abdominal pain 03/25/2013  . Rotator cuff syndrome of left shoulder 07/31/2011   Past Medical History:  Diagnosis Date  . Diabetes mellitus without complication ([place])    Controlled with diet  . Hiatal hernia   . Hyperplastic colon polyp   . Reflux esophagitis    erosive    Family History  Problem Relation Age of Onset  . Anesthesia problems Neg Hx   . Broken bones Neg Hx   . Cancer Neg Hx   . Clotting disorder Neg Hx   . Collagen disease Neg Hx   . Diabetes Neg Hx   . Dislocations Neg Hx   . Osteoporosis Neg Hx   . Rheumatologic disease Neg Hx   . Scoliosis Neg Hx   . Severe sprains Neg Hx     Past Surgical History:  Procedure Laterality Date  . ABDOMINAL HYSTERECTOMY    . COLONOSCOPY  07/29/2007   Dr. Gala Romney- anal papillae, o/w normal rectum, pedunculated polyp,mic sigmoid colon, hyperplastic polyp on bx, the remainder of the colonic mucosa appeared normal  . ESOPHAGOGASTRODUODENOSCOPY N/A 03/31/2013   Dr. Raliegh Scarlet reflux esophagitis, hiatal hernia, gastric erosions, gastric polyp.bx= mild chronic inactive gastritis, fundic gland polyp   Social History   Occupational History  . Occupation:  clerical  Tobacco Use  . Smoking status: Never Smoker  . Smokeless tobacco: Never Used  Substance and Sexual Activity  . Alcohol use: No  . Drug use: No  . Sexual activity: Not on file

## 2017-09-26 NOTE — Telephone Encounter (Signed)
Blue sheet forwarded to Jackelyn Poling to work on scheduling patient for surgery. She will contact patient.

## 2017-09-26 NOTE — Telephone Encounter (Signed)
Surgery sheet sent to [facility] for scheduling. She will contact patient.

## 2017-10-08 DIAGNOSIS — M75111 Incomplete rotator cuff tear or rupture of right shoulder, not specified as traumatic: Secondary | ICD-10-CM | POA: Diagnosis not present

## 2017-10-08 DIAGNOSIS — M7521 Bicipital tendinitis, right shoulder: Secondary | ICD-10-CM | POA: Diagnosis not present

## 2017-10-08 DIAGNOSIS — M7501 Adhesive capsulitis of right shoulder: Secondary | ICD-10-CM | POA: Diagnosis not present

## 2017-10-10 ENCOUNTER — Encounter (INDEPENDENT_AMBULATORY_CARE_PROVIDER_SITE_OTHER): Payer: Self-pay | Admitting: Orthopaedic Surgery

## 2017-10-11 ENCOUNTER — Encounter (INDEPENDENT_AMBULATORY_CARE_PROVIDER_SITE_OTHER): Payer: Self-pay | Admitting: Orthopaedic Surgery

## 2017-10-11 ENCOUNTER — Ambulatory Visit (INDEPENDENT_AMBULATORY_CARE_PROVIDER_SITE_OTHER): Payer: BLUE CROSS/BLUE SHIELD | Admitting: Orthopaedic Surgery

## 2017-10-11 VITALS — BP 127/85 | HR 94

## 2017-10-11 DIAGNOSIS — M67912 Unspecified disorder of synovium and tendon, left shoulder: Secondary | ICD-10-CM

## 2017-10-11 DIAGNOSIS — M75102 Unspecified rotator cuff tear or rupture of left shoulder, not specified as traumatic: Secondary | ICD-10-CM

## 2017-10-11 DIAGNOSIS — M67814 Other specified disorders of tendon, left shoulder: Secondary | ICD-10-CM

## 2017-10-11 NOTE — Progress Notes (Signed)
   Post-Op Visit Note   Patient: Cheryl Gutierrez           Date of Birth: [birth year]           MRN: [id number] Visit Date: 10/11/2017 PCP: Asencion Noble, MD   Assessment & Plan: Follow-up right shoulder biceps tenodesis rotator cuff repair.  She did have some synovitis consistent with early adhesive capsulitis.  She will work on some shoulder circles and elephant swings.  Recheck 2 weeks.  Incisions look good.  Chief Complaint:  Chief Complaint  Patient presents with  . Right Shoulder - Routine Post Op   Visit Diagnoses:  1. Rotator cuff syndrome of left shoulder   2. Biceps tendinosis of left shoulder     Plan: Recheck 2 weeks and we can discuss advancing her home exercises.  Follow-Up Instructions: Return in about 2 weeks (around 10/25/2017).   Orders:  No orders of the defined types were placed in this encounter.  No orders of the defined types were placed in this encounter.   Imaging: No results found.  PMFS History: Patient Active Problem List   Diagnosis Date Noted  . Biceps tendinosis of left shoulder 09/26/2017  . Arthralgia of acromioclavicular joint 05/25/2014  . GERD (gastroesophageal reflux disease) 05/08/2013  . Elevated liver enzymes 05/08/2013  . Abdominal pain 03/25/2013  . Rotator cuff syndrome of left shoulder 07/31/2011   Past Medical History:  Diagnosis Date  . Diabetes mellitus without complication ([place])    Controlled with diet  . Hiatal hernia   . Hyperplastic colon polyp   . Reflux esophagitis    erosive    Family History  Problem Relation Age of Onset  . Anesthesia problems Neg Hx   . Broken bones Neg Hx   . Cancer Neg Hx   . Clotting disorder Neg Hx   . Collagen disease Neg Hx   . Diabetes Neg Hx   . Dislocations Neg Hx   . Osteoporosis Neg Hx   . Rheumatologic disease Neg Hx   . Scoliosis Neg Hx   . Severe sprains Neg Hx     Past Surgical History:  Procedure Laterality Date  . ABDOMINAL HYSTERECTOMY    . COLONOSCOPY   07/29/2007   Dr. Gala Romney- anal papillae, o/w normal rectum, pedunculated polyp,mic sigmoid colon, hyperplastic polyp on bx, the remainder of the colonic mucosa appeared normal  . ESOPHAGOGASTRODUODENOSCOPY N/A 03/31/2013   Dr. Raliegh Scarlet reflux esophagitis, hiatal hernia, gastric erosions, gastric polyp.bx= mild chronic inactive gastritis, fundic gland polyp   Social History   Occupational History  . Occupation: clerical  Tobacco Use  . Smoking status: Never Smoker  . Smokeless tobacco: Never Used  Substance and Sexual Activity  . Alcohol use: No  . Drug use: No  . Sexual activity: Not on file

## 2017-10-25 ENCOUNTER — Ambulatory Visit (INDEPENDENT_AMBULATORY_CARE_PROVIDER_SITE_OTHER): Payer: BLUE CROSS/BLUE SHIELD | Admitting: Orthopaedic Surgery

## 2017-10-25 ENCOUNTER — Encounter (INDEPENDENT_AMBULATORY_CARE_PROVIDER_SITE_OTHER): Payer: Self-pay | Admitting: Orthopaedic Surgery

## 2017-10-25 VITALS — BP 102/72 | HR 92

## 2017-10-25 DIAGNOSIS — M75102 Unspecified rotator cuff tear or rupture of left shoulder, not specified as traumatic: Secondary | ICD-10-CM

## 2017-10-25 NOTE — Progress Notes (Signed)
   Post-Op Visit Note   Patient: Cheryl Gutierrez           Date of Birth: [birth year]           MRN: [id number] Visit Date: 10/25/2017 PCP: Asencion Noble, MD   Assessment & Plan: Follow-up 10/08/17 biceps tenodesis and rotator cuff repair with one anchor.  She likes to use a push mower and she and her husband after retirement do multiple yards.  Her goal is to get back to using a lawnmower.  Will start on some physical therapy with no supraspinatus active or resistive exercises until she is 6 weeks postop.  Recheck 3-4 weeks.  Chief Complaint:  Chief Complaint  Patient presents with  . Right Shoulder - Routine Post Op   Visit Diagnoses:  1. Rotator cuff syndrome of left shoulder     Plan: Return in 3-4 weeks.  Follow-Up Instructions: Return in about 4 weeks (around 11/22/2017).   Orders:  No orders of the defined types were placed in this encounter.  No orders of the defined types were placed in this encounter.   Imaging: No results found.  PMFS History: Patient Active Problem List   Diagnosis Date Noted  . Biceps tendinosis of left shoulder 09/26/2017  . Arthralgia of acromioclavicular joint 05/25/2014  . GERD (gastroesophageal reflux disease) 05/08/2013  . Elevated liver enzymes 05/08/2013  . Abdominal pain 03/25/2013  . Rotator cuff syndrome of left shoulder 07/31/2011   Past Medical History:  Diagnosis Date  . Diabetes mellitus without complication ([place])    Controlled with diet  . Hiatal hernia   . Hyperplastic colon polyp   . Reflux esophagitis    erosive    Family History  Problem Relation Age of Onset  . Anesthesia problems Neg Hx   . Broken bones Neg Hx   . Cancer Neg Hx   . Clotting disorder Neg Hx   . Collagen disease Neg Hx   . Diabetes Neg Hx   . Dislocations Neg Hx   . Osteoporosis Neg Hx   . Rheumatologic disease Neg Hx   . Scoliosis Neg Hx   . Severe sprains Neg Hx     Past Surgical History:  Procedure Laterality Date  . ABDOMINAL  HYSTERECTOMY    . COLONOSCOPY  07/29/2007   Dr. Gala Romney- anal papillae, o/w normal rectum, pedunculated polyp,mic sigmoid colon, hyperplastic polyp on bx, the remainder of the colonic mucosa appeared normal  . ESOPHAGOGASTRODUODENOSCOPY N/A 03/31/2013   Dr. Raliegh Scarlet reflux esophagitis, hiatal hernia, gastric erosions, gastric polyp.bx= mild chronic inactive gastritis, fundic gland polyp   Social History   Occupational History  . Occupation: clerical  Tobacco Use  . Smoking status: Never Smoker  . Smokeless tobacco: Never Used  Substance and Sexual Activity  . Alcohol use: No  . Drug use: No  . Sexual activity: Not on file

## 2017-11-19 ENCOUNTER — Telehealth (INDEPENDENT_AMBULATORY_CARE_PROVIDER_SITE_OTHER): Payer: Self-pay | Admitting: Orthopaedic Surgery

## 2017-11-19 NOTE — Telephone Encounter (Signed)
faxed

## 2017-11-19 NOTE — Telephone Encounter (Signed)
[place] @ the [employer] would like for the last office notes & physical therapy order faxed to her @ [phone]

## 2017-11-19 NOTE — Addendum Note (Signed)
Addended by: Meyer Cory on: 11/19/2017 10:30 AM   Modules accepted: Orders

## 2017-11-22 ENCOUNTER — Ambulatory Visit (INDEPENDENT_AMBULATORY_CARE_PROVIDER_SITE_OTHER): Payer: BLUE CROSS/BLUE SHIELD | Admitting: Orthopaedic Surgery

## 2017-11-22 ENCOUNTER — Encounter (INDEPENDENT_AMBULATORY_CARE_PROVIDER_SITE_OTHER): Payer: Self-pay | Admitting: Orthopaedic Surgery

## 2017-11-22 VITALS — BP 110/66 | HR 94

## 2017-11-22 DIAGNOSIS — M75102 Unspecified rotator cuff tear or rupture of left shoulder, not specified as traumatic: Secondary | ICD-10-CM

## 2017-11-22 DIAGNOSIS — M67814 Other specified disorders of tendon, left shoulder: Secondary | ICD-10-CM

## 2017-11-22 DIAGNOSIS — M67912 Unspecified disorder of synovium and tendon, left shoulder: Secondary | ICD-10-CM

## 2017-11-22 NOTE — Progress Notes (Signed)
   Post-Op Visit Note   Patient: Cheryl Gutierrez           Date of Birth: [birth year]           MRN: [id number] Visit Date: 11/22/2017 PCP: Asencion Noble, MD   Assessment & Plan:post op BT and RCR right  Chief Complaint:  Chief Complaint  Patient presents with  . Right Shoulder - Routine Post Op   Visit Diagnoses:  1. Rotator cuff syndrome of left shoulder   2. Biceps tendinosis of left shoulder     Plan: continue therapy recheck one month  Follow-Up Instructions: Return in about 1 month (around 12/22/2017).   Orders:  No orders of the defined types were placed in this encounter.  No orders of the defined types were placed in this encounter.   Imaging: No results found.  PMFS History: Patient Active Problem List   Diagnosis Date Noted  . Biceps tendinosis of left shoulder 09/26/2017  . Arthralgia of acromioclavicular joint 05/25/2014  . GERD (gastroesophageal reflux disease) 05/08/2013  . Elevated liver enzymes 05/08/2013  . Abdominal pain 03/25/2013  . Rotator cuff syndrome of left shoulder 07/31/2011   Past Medical History:  Diagnosis Date  . Diabetes mellitus without complication ([place])    Controlled with diet  . Hiatal hernia   . Hyperplastic colon polyp   . Reflux esophagitis    erosive    Family History  Problem Relation Age of Onset  . Anesthesia problems Neg Hx   . Broken bones Neg Hx   . Cancer Neg Hx   . Clotting disorder Neg Hx   . Collagen disease Neg Hx   . Diabetes Neg Hx   . Dislocations Neg Hx   . Osteoporosis Neg Hx   . Rheumatologic disease Neg Hx   . Scoliosis Neg Hx   . Severe sprains Neg Hx     Past Surgical History:  Procedure Laterality Date  . ABDOMINAL HYSTERECTOMY    . COLONOSCOPY  07/29/2007   Dr. Gala Romney- anal papillae, o/w normal rectum, pedunculated polyp,mic sigmoid colon, hyperplastic polyp on bx, the remainder of the colonic mucosa appeared normal  . ESOPHAGOGASTRODUODENOSCOPY N/A 03/31/2013   Dr. Raliegh Scarlet reflux  esophagitis, hiatal hernia, gastric erosions, gastric polyp.bx= mild chronic inactive gastritis, fundic gland polyp   Social History   Occupational History  . Occupation: clerical  Tobacco Use  . Smoking status: Never Smoker  . Smokeless tobacco: Never Used  Substance and Sexual Activity  . Alcohol use: No  . Drug use: No  . Sexual activity: Not on file

## 2017-12-27 ENCOUNTER — Encounter (INDEPENDENT_AMBULATORY_CARE_PROVIDER_SITE_OTHER): Payer: Self-pay | Admitting: Orthopaedic Surgery

## 2017-12-27 ENCOUNTER — Ambulatory Visit (INDEPENDENT_AMBULATORY_CARE_PROVIDER_SITE_OTHER): Payer: BLUE CROSS/BLUE SHIELD | Admitting: Orthopaedic Surgery

## 2017-12-27 VITALS — BP 101/61 | HR 97 | Ht 64.0 in | Wt 171.0 lb

## 2017-12-27 DIAGNOSIS — M67912 Unspecified disorder of synovium and tendon, left shoulder: Secondary | ICD-10-CM

## 2017-12-27 DIAGNOSIS — M75102 Unspecified rotator cuff tear or rupture of left shoulder, not specified as traumatic: Secondary | ICD-10-CM

## 2017-12-27 DIAGNOSIS — M67814 Other specified disorders of tendon, left shoulder: Secondary | ICD-10-CM

## 2017-12-27 NOTE — Progress Notes (Signed)
   Post-Op Visit Note   Patient: Cheryl Gutierrez           Date of Birth: 01-Apr-1956           MRN: [id number] Visit Date: 12/27/2017 PCP: Asencion Noble, MD   Assessment & Plan: Patient returns post rotator cuff repair tenodesis.  She is made good progress with therapy is able to get her arm up overhead.  She has a pulley at home and can do her home exercises.  States she feels like she does not need continue formal physical therapy.  Chief Complaint:  Chief Complaint  Patient presents with  . Right Shoulder - Routine Post Op, Follow-up   Visit Diagnoses:  1. Rotator cuff syndrome of left shoulder   2. Biceps tendinosis of left shoulder     Plan: Patient can continue strengthening exercises on her own she can get her arm up overhead is gotten good pain relief.  I will check her back again on a as needed basis and she is happy with the surgical result.  Follow-Up Instructions: No follow-ups on file.   Orders:  No orders of the defined types were placed in this encounter.  No orders of the defined types were placed in this encounter.   Imaging: No results found.  PMFS History: Patient Active Problem List   Diagnosis Date Noted  . Biceps tendinosis of left shoulder 09/26/2017  . Arthralgia of acromioclavicular joint 05/25/2014  . GERD (gastroesophageal reflux disease) 05/08/2013  . Elevated liver enzymes 05/08/2013  . Abdominal pain 03/25/2013  . Rotator cuff syndrome of left shoulder 07/31/2011   Past Medical History:  Diagnosis Date  . Diabetes mellitus without complication ([place])    Controlled with diet  . Hiatal hernia   . Hyperplastic colon polyp   . Reflux esophagitis    erosive    Family History  Problem Relation Age of Onset  . Anesthesia problems Neg Hx   . Broken bones Neg Hx   . Cancer Neg Hx   . Clotting disorder Neg Hx   . Collagen disease Neg Hx   . Diabetes Neg Hx   . Dislocations Neg Hx   . Osteoporosis Neg Hx   . Rheumatologic disease Neg Hx     . Scoliosis Neg Hx   . Severe sprains Neg Hx     Past Surgical History:  Procedure Laterality Date  . ABDOMINAL HYSTERECTOMY    . COLONOSCOPY  07/29/2007   Dr. Gala Romney- anal papillae, o/w normal rectum, pedunculated polyp,mic sigmoid colon, hyperplastic polyp on bx, the remainder of the colonic mucosa appeared normal  . ESOPHAGOGASTRODUODENOSCOPY N/A 03/31/2013   Dr. Raliegh Scarlet reflux esophagitis, hiatal hernia, gastric erosions, gastric polyp.bx= mild chronic inactive gastritis, fundic gland polyp   Social History   Occupational History  . Occupation: clerical  Tobacco Use  . Smoking status: Never Smoker  . Smokeless tobacco: Never Used  Substance and Sexual Activity  . Alcohol use: No  . Drug use: No  . Sexual activity: Not on file

## 2018-12-20 ENCOUNTER — Ambulatory Visit: Payer: Medicare Other | Admitting: Orthopaedic Surgery

## 2018-12-20 ENCOUNTER — Encounter: Payer: Self-pay | Admitting: Orthopaedic Surgery

## 2018-12-20 ENCOUNTER — Other Ambulatory Visit: Payer: Self-pay

## 2018-12-20 ENCOUNTER — Ambulatory Visit: Payer: Self-pay

## 2018-12-20 VITALS — Ht 64.0 in | Wt 166.0 lb

## 2018-12-20 DIAGNOSIS — M25561 Pain in right knee: Secondary | ICD-10-CM | POA: Diagnosis not present

## 2018-12-20 MED ORDER — BUPIVACAINE HCL 0.25 % IJ SOLN
4.0000 mL | INTRAMUSCULAR | Status: AC | PRN
  Administered 2018-12-20: 09:00:00 4 mL via INTRA_ARTICULAR

## 2018-12-20 MED ORDER — METHYLPREDNISOLONE ACETATE 40 MG/ML IJ SUSP
40.0000 mg | INTRAMUSCULAR | Status: AC | PRN
  Administered 2018-12-20: 09:00:00 40 mg via INTRA_ARTICULAR

## 2018-12-20 MED ORDER — LIDOCAINE HCL 1 % IJ SOLN
0.5000 mL | INTRAMUSCULAR | Status: AC | PRN
  Administered 2018-12-20: .5 mL

## 2018-12-20 NOTE — Progress Notes (Signed)
Office Visit Note   Patient: Cheryl Gutierrez           Date of Birth: October 25, 1955           MRN: [id number] Visit Date: 12/20/2018              Requested by: Asencion Noble, MD [address] [place], [place] [address] PCP: Asencion Noble, MD   Assessment & Plan: Visit Diagnoses:  1. Acute pain of right knee     Plan: Injection performed right knee with slight improvement.  She is still has sharp pain with attempts at weightbearing points to the posterior lateral joint line.  No true locking was demonstrated today.  We will see how she does over the weekend consider physical therapy next week.  If she has persistent symptoms we will need to proceed with diagnostic MRI scan.  Follow-Up Instructions: No follow-ups on file.   Orders:  Orders Placed This Encounter  Procedures  . XR KNEE 3 VIEW RIGHT   No orders of the defined types were placed in this encounter.     Procedures: Large Joint Inj: R knee on 12/20/2018 8:55 AM Indications: pain and joint swelling Details: 22 G 1.5 in needle, anterolateral approach  Arthrogram: No  Medications: 40 mg methylPREDNISolone acetate 40 MG/ML; 0.5 mL lidocaine 1 %; 4 mL bupivacaine 0.25 % Outcome: tolerated well, no immediate complications Procedure, treatment alternatives, risks and benefits explained, specific risks discussed. Consent was given by the patient. Immediately prior to procedure a time out was called to verify the correct patient, procedure, equipment, support staff and site/side marked as required. Patient was prepped and draped in the usual sterile fashion.       Clinical Data: No additional findings.   Subjective: Chief Complaint  Patient presents with  . Right Knee - Pain    HPI 63 year old female seen with right knee pain.  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  She usually uses a push mower and just does the small areas.  2 weeks ago  started becoming painful she had some trouble when she first got up walking pain primarily lateral and posterior.  She has not noticed any swelling.  Yesterday after mowing the yard she try to get out of a car could not walk could not bear weight had sharp pain and is taken her several minutes to get her knee out into extension.  She has no pain when she sitting or supine.  Pain with the last 20 degrees of terminal extension.  When she straightens it she is heard a loud pop which is been painful.  No past history of injury to her knee.  I did previous rotator cuff surgery she states her shoulder is doing well.  Review of Systems positive for a previous rotator cuff and biceps tenodesis left shoulder.  Positive for GERD.  Elevated liver enzymes.  Acute right knee pain currently.  14 point of systems otherwise negative is obtains HPI.   Objective: Vital Signs: Ht 5\' 4"  (1.626 m)   Wt 166 lb (75.3 kg)   BMI 28.49 kg/m   Physical Exam Constitutional:      Appearance: She is well-developed.  HENT:     Head: Normocephalic.     Right Ear: External ear normal.     Left Ear: External ear normal.  Eyes:     Pupils: Pupils are equal,  round, and reactive to light.  Neck:     Thyroid: No thyromegaly.     Trachea: No tracheal deviation.  Cardiovascular:     Rate and Rhythm: Normal rate.  Pulmonary:     Effort: Pulmonary effort is normal.  Abdominal:     Palpations: Abdomen is soft.  Skin:    General: Skin is warm and dry.  Neurological:     Mental Status: She is alert and oriented to person, place, and time.  Psychiatric:        Behavior: Behavior normal.     Ortho Exam: Patient has a walker is not able to ambulate without the walker.  She has sharp pain if she reaches terminal extension from 20 degrees straight notices a pop.  Pain posterior lateral joint line.  No knee effusion.  Mild crepitus with flexion-extension negative logroll to the hips negative popliteal compression test no  palpable Baker's cyst.  Distal pulses are 2+ good gastrocsoleus strength no sciatic notch tenderness lumbar spine shows no areas of tenderness.  Good hip flexion anterior tib is strong.  Normal patellar tracking.  Biceps femoris is normal.  No tenderness over the iliotibial band.  Specialty Comments:  No specialty comments available.  Imaging: No results found.   PMFS History: Patient Active Problem List   Diagnosis Date Noted  . Biceps tendinosis of left shoulder 09/26/2017  . Arthralgia of acromioclavicular joint 05/25/2014  . GERD (gastroesophageal reflux disease) 05/08/2013  . Elevated liver enzymes 05/08/2013  . Abdominal pain 03/25/2013  . Rotator cuff syndrome of left shoulder 07/31/2011   Past Medical History:  Diagnosis Date  . Diabetes mellitus without complication ([place])    Controlled with diet  . Hiatal hernia   . Hyperplastic colon polyp   . Reflux esophagitis    erosive    Family History  Problem Relation Age of Onset  . Anesthesia problems Neg Hx   . Broken bones Neg Hx   . Cancer Neg Hx   . Clotting disorder Neg Hx   . Collagen disease Neg Hx   . Diabetes Neg Hx   . Dislocations Neg Hx   . Osteoporosis Neg Hx   . Rheumatologic disease Neg Hx   . Scoliosis Neg Hx   . Severe sprains Neg Hx     Past Surgical History:  Procedure Laterality Date  . ABDOMINAL HYSTERECTOMY    . COLONOSCOPY  07/29/2007   Dr. Gala Romney- anal papillae, o/w normal rectum, pedunculated polyp,mic sigmoid colon, hyperplastic polyp on bx, the remainder of the colonic mucosa appeared normal  . ESOPHAGOGASTRODUODENOSCOPY N/A 03/31/2013   Dr. Raliegh Scarlet reflux esophagitis, hiatal hernia, gastric erosions, gastric polyp.bx= mild chronic inactive gastritis, fundic gland polyp   Social History   Occupational History  . Occupation: clerical  Tobacco Use  . Smoking status: Never Smoker  . Smokeless tobacco: Never Used  Substance and Sexual Activity  . Alcohol use: No  . Drug use: No   . Sexual activity: Not on file

## 2019-01-02 ENCOUNTER — Ambulatory Visit: Payer: Medicare Other | Admitting: Orthopaedic Surgery

## 2019-01-16 ENCOUNTER — Ambulatory Visit (INDEPENDENT_AMBULATORY_CARE_PROVIDER_SITE_OTHER): Payer: Medicare Other

## 2019-01-16 ENCOUNTER — Encounter: Payer: Self-pay | Admitting: Orthopaedic Surgery

## 2019-01-16 ENCOUNTER — Ambulatory Visit (INDEPENDENT_AMBULATORY_CARE_PROVIDER_SITE_OTHER): Payer: Medicare Other | Admitting: Orthopaedic Surgery

## 2019-01-16 ENCOUNTER — Ambulatory Visit: Payer: Medicare Other | Admitting: Orthopaedic Surgery

## 2019-01-16 VITALS — Ht 64.0 in | Wt 166.0 lb

## 2019-01-16 DIAGNOSIS — M545 Low back pain, unspecified: Secondary | ICD-10-CM

## 2019-01-16 NOTE — Progress Notes (Signed)
Office Visit Note   Patient: Cheryl Gutierrez           Date of Birth: 03-08-1956           MRN: [id number] Visit Date: 01/16/2019              Requested by: Asencion Noble, MD [address] [place], [place] [address] PCP: Asencion Noble, MD   Assessment & Plan: Visit Diagnoses:  1. Acute right-sided low back pain, unspecified whether sciatica present     Plan: We will set patient up for some physical therapy.  She still having limping when she walks but tends to do better when she is up and active.  It is difficult to determine this is coming from the lumbar spine but her hip joint has good range of motion and similar symptoms may be related to her right knee.  We will set up for some physical therapy for treatment and see her back again in 4 weeks and then we can decide if imaging studies are required.  Follow-Up Instructions: Return in about 4 weeks (around 02/13/2019).   Orders:  Orders Placed This Encounter  Procedures   XR Lumbar Spine 2-3 Views   Ambulatory referral to Physical Therapy   No orders of the defined types were placed in this encounter.     Procedures: No procedures performed   Clinical Data: No additional findings.   Subjective: Chief Complaint  Patient presents with   Right Knee - Pain    HPI 63 year old female returns states injection in her knee at last office visit only gave her 1 day relief.  She has had stiffness after prolonged sitting and has posterior lateral distal thigh pain that radiates in the popliteal region and into the lateral calf.  It stops before it hits the ankle.  The more she moves around the better she seems to do.  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  She has increased problems with stiffness and has some trouble ambulating and has prominent limp when she first gets up.  She is noted difficulty with figure 4 of her right hip.  No problems with the left.   No swelling no fever no chills.  Previous visit patient can only ambulate with a walker.  She is now ambulating without a walker or cane but still has some limping which tends to improve the more she walks in the hall.  No associated bowel or bladder symptoms.  Review of Systems 14 point system update unchanged from 12/20/2018.  Previous rotator cuff and biceps tendon surgery left shoulder.  Elevated liver enzymes in the past.  Current right knee pain and buttocks pain.   Objective: Vital Signs: Ht 5\' 4"  (1.626 m)    Wt 166 lb (75.3 kg)    BMI 28.49 kg/m   Physical Exam Constitutional:      Appearance: She is well-developed.  HENT:     Head: Normocephalic.     Right Ear: External ear normal.     Left Ear: External ear normal.  Eyes:     Pupils: Pupils are equal, round, and reactive to light.  Neck:     Thyroid: No thyromegaly.     Trachea: No tracheal deviation.  Cardiovascular:     Rate and Rhythm: Normal rate.  Pulmonary:     Effort: Pulmonary effort is normal.  Abdominal:     Palpations: Abdomen is  soft.  Skin:    General: Skin is warm and dry.  Neurological:     Mental Status: She is alert and oriented to person, place, and time.  Psychiatric:        Behavior: Behavior normal.     Ortho Exam iliotibial band is normal.  No knee effusion.  No pain with internal rotation 3540 degrees right or left hip.  Difficulty figure 4 on the right no problems on the left hip.  She has some sciatic notch tenderness on the right some tenderness palpation lumbosacral junction.  Greater trochanteric bursal region is nontender.  No abduction or hip flexion weakness quads are strong anterior tib gastrocsoleus is strong.  Distal pulses are 2+.  Specialty Comments:  No specialty comments available.  Imaging: Xr Lumbar Spine 2-3 Views  Result Date: 01/16/2019 AP lateral lumbar spine x-rays are obtained and reviewed including spot L5-S1 image.  This shows endplate spondylosis at L2-3 more  prominent on the right than left.  Facet arthropathy at L4-5 and L5-S1 with greater than 50% narrowing L5-S1.  Negative for acute changes no spondylolisthesis. Impression: Multilevel spondylitic lumbar changes as described above most prominent at L5-S1.    PMFS History: Patient Active Problem List   Diagnosis Date Noted   Biceps tendinosis of left shoulder 09/26/2017   Arthralgia of acromioclavicular joint 05/25/2014   GERD (gastroesophageal reflux disease) 05/08/2013   Elevated liver enzymes 05/08/2013   Abdominal pain 03/25/2013   Rotator cuff syndrome of left shoulder 07/31/2011   Past Medical History:  Diagnosis Date   Diabetes mellitus without complication ([place])    Controlled with diet   Hiatal hernia    Hyperplastic colon polyp    Reflux esophagitis    erosive    Family History  Problem Relation Age of Onset   Anesthesia problems Neg Hx    Broken bones Neg Hx    Cancer Neg Hx    Clotting disorder Neg Hx    Collagen disease Neg Hx    Diabetes Neg Hx    Dislocations Neg Hx    Osteoporosis Neg Hx    Rheumatologic disease Neg Hx    Scoliosis Neg Hx    Severe sprains Neg Hx     Past Surgical History:  Procedure Laterality Date   ABDOMINAL HYSTERECTOMY     COLONOSCOPY  07/29/2007   Dr. Gala Romney- anal papillae, o/w normal rectum, pedunculated polyp,mic sigmoid colon, hyperplastic polyp on bx, the remainder of the colonic mucosa appeared normal   ESOPHAGOGASTRODUODENOSCOPY N/A 03/31/2013   Dr. Raliegh Scarlet reflux esophagitis, hiatal hernia, gastric erosions, gastric polyp.bx= mild chronic inactive gastritis, fundic gland polyp   Social History   Occupational History   Occupation: clerical  Tobacco Use   Smoking status: Never Smoker   Smokeless tobacco: Never Used  Substance and Sexual Activity   Alcohol use: No   Drug use: No   Sexual activity: Not on file

## 2019-02-13 ENCOUNTER — Ambulatory Visit (INDEPENDENT_AMBULATORY_CARE_PROVIDER_SITE_OTHER): Payer: Medicare Other | Admitting: Orthopaedic Surgery

## 2019-02-13 ENCOUNTER — Other Ambulatory Visit: Payer: Self-pay

## 2019-02-13 ENCOUNTER — Encounter: Payer: Self-pay | Admitting: Orthopaedic Surgery

## 2019-02-13 VITALS — BP 111/65 | HR 85 | Ht 64.0 in | Wt 166.0 lb

## 2019-02-13 DIAGNOSIS — M545 Low back pain, unspecified: Secondary | ICD-10-CM

## 2019-02-13 NOTE — Progress Notes (Signed)
Office Visit Note   Patient: Cheryl Gutierrez           Date of Birth: November 19, 1955           MRN: [id number] Visit Date: 02/13/2019              Requested by: Asencion Noble, MD [address] [place],  [place] [address] PCP: Asencion Noble, MD   Assessment & Plan: Visit Diagnoses:  1. Low back pain without sciatica, unspecified back pain laterality, unspecified chronicity     Plan: Patient is got good relief of her symptoms with physical therapy she will continue the home exercise program continue walking program.  She has some balance problems but is not fallen and we discussed continued strengthening and walking activities to decrease her fall risk.  Follow-Up Instructions: Return if symptoms worsen or fail to improve.   Orders:  No orders of the defined types were placed in this encounter.  No orders of the defined types were placed in this encounter.     Procedures: No procedures performed   Clinical Data: No additional findings.   Subjective: Chief Complaint  Patient presents with  . Right Knee - Follow-up    HPI patient turns for follow-up she has been through physical therapy and states her back and right leg symptoms have significantly improved.  She thinks some of the deep tissue massage helped a lot she states she is 99 percent pain-free.  She has had some recent problems with poison ivy between the fingers of her left hand and along the left side of her face which is been a recurrent problem.  She denies fever chills no bowel bladder symptoms.  Review of Systems 14 point update unchanged from 01/16/2019 office visit.   Objective: Vital Signs: BP 111/65   Pulse 85   Ht 5\' 4"  (1.626 m)   Wt 166 lb (75.3 kg)   BMI 28.49 kg/m   Physical Exam Constitutional:      Appearance: She is well-developed.  HENT:     Head: Normocephalic.     Right Ear: External ear normal.     Left Ear: External ear normal.  Eyes:     Pupils: Pupils are equal, round, and  reactive to light.  Neck:     Thyroid: No thyromegaly.     Trachea: No tracheal deviation.  Cardiovascular:     Rate and Rhythm: Normal rate.  Pulmonary:     Effort: Pulmonary effort is normal.  Abdominal:     Palpations: Abdomen is soft.  Skin:    General: Skin is warm and dry.  Neurological:     Mental Status: She is alert and oriented to person, place, and time.  Psychiatric:        Behavior: Behavior normal.     Ortho Exam patient gets from sitting to standing comfortably.  No pain with rotation of her hips knees reach full extension.  She is able to get up from a chair without using her arms.  Rotation is full without pain.  Specialty Comments:  No specialty comments available.  Imaging: No results found.   PMFS History: Patient Active Problem List   Diagnosis Date Noted  . Biceps tendinosis of left shoulder 09/26/2017  . Arthralgia of acromioclavicular joint 05/25/2014  . GERD (gastroesophageal reflux disease) 05/08/2013  . Elevated liver enzymes 05/08/2013  . Abdominal pain 03/25/2013  . Rotator cuff syndrome of left shoulder 07/31/2011   Past Medical History:  Diagnosis Date  .  Diabetes mellitus without complication ([place])    Controlled with diet  . Hiatal hernia   . Hyperplastic colon polyp   . Reflux esophagitis    erosive    Family History  Problem Relation Age of Onset  . Anesthesia problems Neg Hx   . Broken bones Neg Hx   . Cancer Neg Hx   . Clotting disorder Neg Hx   . Collagen disease Neg Hx   . Diabetes Neg Hx   . Dislocations Neg Hx   . Osteoporosis Neg Hx   . Rheumatologic disease Neg Hx   . Scoliosis Neg Hx   . Severe sprains Neg Hx     Past Surgical History:  Procedure Laterality Date  . ABDOMINAL HYSTERECTOMY    . COLONOSCOPY  07/29/2007   Dr. Gala Romney- anal papillae, o/w normal rectum, pedunculated polyp,mic sigmoid colon, hyperplastic polyp on bx, the remainder of the colonic mucosa appeared normal  . ESOPHAGOGASTRODUODENOSCOPY N/A  03/31/2013   Dr. Raliegh Scarlet reflux esophagitis, hiatal hernia, gastric erosions, gastric polyp.bx= mild chronic inactive gastritis, fundic gland polyp   Social History   Occupational History  . Occupation: clerical  Tobacco Use  . Smoking status: Never Smoker  . Smokeless tobacco: Never Used  Substance and Sexual Activity  . Alcohol use: No  . Drug use: No  . Sexual activity: Not on file

## 2019-06-19 ENCOUNTER — Ambulatory Visit: Payer: Medicare Other | Admitting: Orthopaedic Surgery

## 2020-08-03 ENCOUNTER — Other Ambulatory Visit: Payer: Self-pay

## 2020-08-03 ENCOUNTER — Ambulatory Visit (HOSPITAL_COMMUNITY)
Admission: RE | Admit: 2020-08-03 | Discharge: 2020-08-03 | Disposition: A | Discharge status: Home / Self Care | Payer: Medicare Other | Source: Ambulatory Visit | Attending: Nurse Practitioner | Admitting: Nurse Practitioner

## 2020-08-03 ENCOUNTER — Other Ambulatory Visit (HOSPITAL_COMMUNITY): Payer: Self-pay | Admitting: Nurse Practitioner

## 2020-08-03 DIAGNOSIS — R52 Pain, unspecified: Secondary | ICD-10-CM

## 2020-11-16 ENCOUNTER — Other Ambulatory Visit: Payer: Self-pay | Admitting: Nurse Practitioner

## 2020-11-16 DIAGNOSIS — R1013 Epigastric pain: Secondary | ICD-10-CM

## 2020-11-19 ENCOUNTER — Ambulatory Visit (HOSPITAL_COMMUNITY)
Admission: RE | Admit: 2020-11-19 | Discharge: 2020-11-19 | Disposition: A | Discharge status: Home / Self Care | Payer: Medicare Other | Source: Ambulatory Visit | Attending: Nurse Practitioner | Admitting: Nurse Practitioner

## 2020-11-19 DIAGNOSIS — R1013 Epigastric pain: Secondary | ICD-10-CM | POA: Insufficient documentation

## 2020-12-23 ENCOUNTER — Emergency Department (HOSPITAL_COMMUNITY)
Admission: EM | Admit: 2020-12-23 | Discharge: 2020-12-23 | Disposition: A | Discharge status: Home / Self Care | Payer: Medicare Other | Attending: Emergency Medicine | Admitting: Emergency Medicine

## 2020-12-23 ENCOUNTER — Other Ambulatory Visit: Payer: Self-pay

## 2020-12-23 ENCOUNTER — Encounter (HOSPITAL_COMMUNITY): Payer: Self-pay | Admitting: Emergency Medicine

## 2020-12-23 ENCOUNTER — Emergency Department (HOSPITAL_COMMUNITY): Payer: Medicare Other

## 2020-12-23 DIAGNOSIS — R110 Nausea: Secondary | ICD-10-CM

## 2020-12-23 DIAGNOSIS — R112 Nausea with vomiting, unspecified: Secondary | ICD-10-CM | POA: Diagnosis not present

## 2020-12-23 DIAGNOSIS — E119 Type 2 diabetes mellitus without complications: Secondary | ICD-10-CM | POA: Insufficient documentation

## 2020-12-23 DIAGNOSIS — R55 Syncope and collapse: Secondary | ICD-10-CM | POA: Diagnosis present

## 2020-12-23 DIAGNOSIS — R1013 Epigastric pain: Secondary | ICD-10-CM | POA: Insufficient documentation

## 2020-12-23 LAB — COMPREHENSIVE METABOLIC PANEL
ALT: 26 U/L (ref 0–44)
AST: 23 U/L (ref 15–41)
Albumin: 3.8 g/dL (ref 3.5–5.0)
Alkaline Phosphatase: 78 U/L (ref 38–126)
Anion gap: 8 (ref 5–15)
BUN: 20 mg/dL (ref 8–23)
CO2: 23 mmol/L (ref 22–32)
Calcium: 8.3 mg/dL — ABNORMAL LOW (ref 8.9–10.3)
Chloride: 106 mmol/L (ref 98–111)
Creatinine, Ser: 0.99 mg/dL (ref 0.44–1.00)
GFR, Estimated: >60 mL/min (ref >60)
Glucose, Bld: 147 mg/dL — ABNORMAL HIGH (ref 70–99)
Potassium: 4.7 mmol/L (ref 3.5–5.1)
Sodium: 137 mmol/L (ref 135–145)
Total Bilirubin: 0.6 mg/dL (ref 0.3–1.2)
Total Protein: 6.5 g/dL (ref 6.5–8.1)

## 2020-12-23 LAB — URINALYSIS, ROUTINE W REFLEX MICROSCOPIC
Bacteria, UA: NONE SEEN
Bilirubin Urine: NEGATIVE
Glucose, UA: NEGATIVE mg/dL
Hgb urine dipstick: NEGATIVE
Ketones, ur: NEGATIVE mg/dL
Nitrite: NEGATIVE
Protein, ur: NEGATIVE mg/dL
Specific Gravity, Urine: 1.018 (ref 1.005–1.030)
pH: 6 (ref 5.0–8.0)

## 2020-12-23 LAB — LIPASE, BLOOD: Lipase: 36 U/L (ref 11–51)

## 2020-12-23 LAB — CBC
HCT: 43.5 % (ref 36.0–46.0)
Hemoglobin: 14.1 g/dL (ref 12.0–15.0)
MCH: 29.1 pg (ref 26.0–34.0)
MCHC: 32.4 g/dL (ref 30.0–36.0)
MCV: 89.9 fL (ref 80.0–100.0)
Platelets: 306 10*3/uL (ref 150–400)
RBC: 4.84 MIL/uL (ref 3.87–5.11)
RDW: 12.2 % (ref 11.5–15.5)
WBC: 16.5 10*3/uL — ABNORMAL HIGH (ref 4.0–10.5)
nRBC: 0 % (ref 0.0–0.2)

## 2020-12-23 LAB — CBG MONITORING, ED: Glucose-Capillary: 121 mg/dL — ABNORMAL HIGH (ref 70–99)

## 2020-12-23 LAB — TROPONIN I (HIGH SENSITIVITY): Troponin I (High Sensitivity): 3 ng/L (ref <18)

## 2020-12-23 MED ORDER — ONDANSETRON 4 MG PO TBDP: 4.0000 mg | ORAL_TABLET | Freq: Three times a day (TID) | ORAL | 0 refills | Status: DC | PRN

## 2020-12-23 MED ORDER — ONDANSETRON HCL 4 MG/2ML IJ SOLN
4.0000 mg | INTRAMUSCULAR | Status: AC
  Administered 2020-12-23: 4 mg via INTRAVENOUS
  Filled 2020-12-23: qty 2

## 2020-12-23 MED ORDER — ONDANSETRON 4 MG PO TBDP
4.0000 mg | ORAL_TABLET | Freq: Three times a day (TID) | ORAL | 0 refills | Status: DC | PRN
  Filled 2020-12-23: qty 10, 4d supply, fill #0

## 2020-12-23 MED ORDER — SODIUM CHLORIDE 0.9 % IV BOLUS
1000.0000 mL | Freq: Once | INTRAVENOUS | Status: AC
  Administered 2020-12-23: 1000 mL via INTRAVENOUS

## 2020-12-23 NOTE — ED Notes (Signed)
Patient transported to CT 

## 2020-12-23 NOTE — ED Provider Notes (Signed)
[facility] EMERGENCY DEPARTMENT Provider Note   CSN: [id number] Arrival date & time: 12/23/20  1905     History Chief Complaint  Patient presents with  . Loss of Consciousness    Cheryl Gutierrez is a 65 y.o. female.  HPI    65 y/o female - hx of DM, hx of esophagitis from GERD, and hx of "passing out easily" - presents stating that she had a syncopal event tonight - this occurred when she was nauseated - felt as though she had to have diarrhea and ran to the bathroom to use the bathroom - she was found face down on bed in her vomit - she has less epigastric pain now - though has more earlier in the day - had a hamburger from [employer] - states it felt better - no headache, no blurred vision, no cough or SOB and no f/c.  She has not been around any sick contacts - she has no hx of CAD.  Seh is still nauseated - reports that she has a hx of a "stomach mass" - feeling like she has a rock in her stomach - and has for months - she has had referral to GI but won't see them for 3 months - she has no blood in stools and no urinary sx.  Past Medical History:  Diagnosis Date  . Diabetes mellitus without complication ([place])    Controlled with diet  . Hiatal hernia   . Hyperplastic colon polyp   . Reflux esophagitis    erosive    Patient Active Problem List   Diagnosis Date Noted  . Biceps tendinosis of left shoulder 09/26/2017  . Arthralgia of acromioclavicular joint 05/25/2014  . GERD (gastroesophageal reflux disease) 05/08/2013  . Elevated liver enzymes 05/08/2013  . Abdominal pain 03/25/2013  . Rotator cuff syndrome of left shoulder 07/31/2011    Past Surgical History:  Procedure Laterality Date  . ABDOMINAL HYSTERECTOMY    . COLONOSCOPY  07/29/2007   Dr. Gala Romney- anal papillae, o/w normal rectum, pedunculated polyp,mic sigmoid colon, hyperplastic polyp on bx, the remainder of the colonic mucosa appeared normal  . ESOPHAGOGASTRODUODENOSCOPY N/A 03/31/2013   Dr. Raliegh Scarlet reflux  esophagitis, hiatal hernia, gastric erosions, gastric polyp.bx= mild chronic inactive gastritis, fundic gland polyp     OB History   No obstetric history on file.     Family History  Problem Relation Age of Onset  . Anesthesia problems Neg Hx   . Broken bones Neg Hx   . Cancer Neg Hx   . Clotting disorder Neg Hx   . Collagen disease Neg Hx   . Diabetes Neg Hx   . Dislocations Neg Hx   . Osteoporosis Neg Hx   . Rheumatologic disease Neg Hx   . Scoliosis Neg Hx   . Severe sprains Neg Hx     Social History   Tobacco Use  . Smoking status: Never Smoker  . Smokeless tobacco: Never Used  Substance Use Topics  . Alcohol use: No  . Drug use: No    Home Medications Prior to Admission medications   Medication Sig Start Date End Date Taking? Authorizing Provider  atorvastatin (LIPITOR) 20 MG tablet Take 20 mg by mouth daily. 05/17/17   [provider]  buPROPion (WELLBUTRIN XL) 150 MG 24 hr tablet Take 150 mg by mouth daily. 05/17/17   [provider]  cholecalciferol (VITAMIN D) 1000 UNITS tablet Take 1,000 Units by mouth daily. Reported on 11/16/2015    [provider]  methocarbamol (ROBAXIN) 500 MG tablet  10/09/17   [provider]  ondansetron (ZOFRAN ODT) 4 MG disintegrating tablet Take 1 tablet (4 mg total) by mouth every 8 (eight) hours as needed for nausea. 12/23/20   Noemi Chapel, MD  oxyCODONE-acetaminophen (PERCOCET/ROXICET) 5-325 MG tablet  10/09/17   [provider]  PARoxetine (PAXIL) 40 MG tablet Take 40 mg by mouth every morning.      [provider]    Allergies    Patient has no known allergies.  Review of Systems   Review of Systems  All other systems reviewed and are negative.   Physical Exam Updated Vital Signs BP (!) 128/57   Pulse (!) 106   Temp 97.8 F (36.6 C)   Resp (!) 21   Ht 1.626 m (5\' 4" )   Wt 75.3 kg   SpO2 95%   BMI 28.49 kg/m   Physical Exam Vitals and nursing note reviewed.   Constitutional:      General: She is not in acute distress.    Appearance: She is well-developed.  HENT:     Head: Normocephalic and atraumatic.     Mouth/Throat:     Pharynx: No oropharyngeal exudate.  Eyes:     General: No scleral icterus.       Right eye: No discharge.        Left eye: No discharge.     Conjunctiva/sclera: Conjunctivae normal.     Pupils: Pupils are equal, round, and reactive to light.  Neck:     Thyroid: No thyromegaly.     Vascular: No JVD.  Cardiovascular:     Rate and Rhythm: Normal rate and regular rhythm.     Heart sounds: Normal heart sounds. No murmur heard. No friction rub. No gallop.      Comments: Normal pulses, no edema, no JVD Pulmonary:     Effort: Pulmonary effort is normal. No respiratory distress.     Breath sounds: Normal breath sounds. No wheezing or rales.  Abdominal:     General: Bowel sounds are normal. There is no distension.     Palpations: Abdomen is soft. There is no mass.     Tenderness: There is abdominal tenderness ( mimimal epigastric ttp, no other abd ttp.).  Musculoskeletal:        General: No tenderness. Normal range of motion.     Cervical back: Normal range of motion and neck supple.  Lymphadenopathy:     Cervical: No cervical adenopathy.  Skin:    General: Skin is warm and dry.     Findings: No erythema or rash.  Neurological:     Mental Status: She is alert.     Coordination: Coordination normal.     Comments: Neurologic exam:  Speech clear, pupils equal round reactive to light, extraocular movements intact  Normal peripheral visual fields Cranial nerves III through XII normal including no facial droop Follows commands, moves all extremities x4, normal strength to bilateral upper and lower extremities at all major muscle groups including grip Sensation normal to light touch and pinprick Coordination intact, no limb ataxia,    Psychiatric:        Behavior: Behavior normal.     ED Results / Procedures /  Treatments   Labs (all labs ordered are listed, but only abnormal results are displayed) Labs Reviewed  CBC - Abnormal; Notable for the following components:      Result Value   WBC 16.5 (*)    All other  components within normal limits  COMPREHENSIVE METABOLIC PANEL - Abnormal; Notable for the following components:   Glucose, Bld 147 (*)    Calcium 8.3 (*)    All other components within normal limits  URINALYSIS, ROUTINE W REFLEX MICROSCOPIC - Abnormal; Notable for the following components:   Leukocytes,Ua MODERATE (*)    All other components within normal limits  CBG MONITORING, ED - Abnormal; Notable for the following components:   Glucose-Capillary 121 (*)    All other components within normal limits  LIPASE, BLOOD  TROPONIN I (HIGH SENSITIVITY)    EKG EKG Interpretation  Date/Time:  Thursday Dec 23 2020 19:16:07 EDT Ventricular Rate:  82 PR Interval:  153 QRS Duration: 90 QT Interval:  377 QTC Calculation: 441 R Axis:   79 Text Interpretation: Sinus rhythm Ventricular premature complex Low voltage with right axis deviation Nonspecific T abnormalities, lateral leads Confirmed by Noemi Chapel [phone]) on 12/23/2020 7:24:27 PM   Radiology CT ABDOMEN PELVIS WO CONTRAST  Result Date: 12/23/2020 CLINICAL DATA:  Epigastric pain and vomiting EXAM: CT ABDOMEN AND PELVIS WITHOUT CONTRAST TECHNIQUE: Multidetector CT imaging of the abdomen and pelvis was performed following the standard protocol without IV contrast. COMPARISON:  11/19/2020 FINDINGS: Lower chest: No acute abnormality. Hepatobiliary: No focal liver abnormality is seen. No gallstones, gallbladder wall thickening, or biliary dilatation. Pancreas: Unremarkable. No pancreatic ductal dilatation or surrounding inflammatory changes. Spleen: Normal in size without focal abnormality. Adrenals/Urinary Tract: Adrenal glands are within normal limits. Kidneys demonstrate tiny nonobstructing renal stone in the lower pole of the left  kidney measuring 1-2 mm. Ureters are within normal limits. Bladder is partially distended. Stomach/Bowel: Fluid and fecal material is noted within the colon without obstructive change. The appendix is within normal limits. Small bowel is unremarkable. Small sliding-type hiatal hernia is noted. Stomach is otherwise within normal limits. Vascular/Lymphatic: Aortic atherosclerosis. No enlarged abdominal or pelvic lymph nodes. Reproductive: Status post hysterectomy. No adnexal masses. Other: No abdominal wall hernia or abnormality. No abdominopelvic ascites. Musculoskeletal: No acute or significant osseous findings. IMPRESSION: Small sliding-type hiatal hernia. Nonobstructing left renal stone. No other focal abnormality is noted. Electronically Signed   By: Inez Catalina M.D.   On: 12/23/2020 19:59    Procedures Procedures   Medications Ordered in ED Medications  ondansetron (ZOFRAN) injection 4 mg (4 mg Intravenous Given 12/23/20 1935)  sodium chloride 0.9 % bolus 1,000 mL (0 mLs Intravenous Stopped 12/23/20 2249)    ED Course  I have reviewed the triage vital signs and the nursing notes.  Pertinent labs & imaging results that were available during my care of the patient were reviewed by me and considered in my medical decision making (see chart for details).  Clinical Course as of 12/23/20 2306  Thu Dec 23, 2020  2132 Albumin: 3.8 [BM]    Clinical Course User Index [BM] Noemi Chapel, MD   MDM Rules/Calculators/A&P                          Well appearing, feels nauseated - EKG without acute findings - no ischemia or arrhythmia - had n/v/d and syncope - likely benign but labs pending.  No neuro findings to suggest need for CT and no head trauma.  Will CT abd due to no prior abd pain w/u  The patient feels better, she is no longer nauseated, her urinalysis reveals no bacteria, she has no urinary symptoms.  She is better with Zofran and fluids.  At  this time she is stable for discharge and  requesting to be discharged, I think this is reasonable, normal troponin, no acute findings to suggest the need for further work-up  Prescription for Zofran given  Final Clinical Impression(s) / ED Diagnoses Final diagnoses:  Syncope, unspecified syncope type  Nauseated    Rx / DC Orders ED Discharge Orders         Ordered    ondansetron (ZOFRAN ODT) 4 MG disintegrating tablet  Every 8 hours PRN,   Status:  Discontinued        12/23/20 2305    ondansetron (ZOFRAN ODT) 4 MG disintegrating tablet  Every 8 hours PRN        12/23/20 2305           Noemi Chapel, MD 12/23/20 2306

## 2020-12-23 NOTE — ED Notes (Signed)
Pt up to bedside toilet- pt got nauseated when getting back in bed. Pt vomited once- small amount. Spouse asking about discharge papers- Dr Sabra Heck made aware.

## 2020-12-23 NOTE — ED Notes (Signed)
Pt up to bedside toilet with standby assist.

## 2020-12-23 NOTE — Discharge Instructions (Signed)
Zofran as needed for nausea ER for worsening symtpoms Drink plenty of clear liquids

## 2020-12-23 NOTE — ED Triage Notes (Signed)
[facility] - pt got up to go to the bathroom to vomit, pt's husband found her unconscious on bed, face down with vomit on the bed.

## 2020-12-24 ENCOUNTER — Other Ambulatory Visit (HOSPITAL_COMMUNITY): Payer: Self-pay

## 2021-01-06 ENCOUNTER — Other Ambulatory Visit (HOSPITAL_COMMUNITY): Payer: Self-pay | Admitting: Nurse Practitioner

## 2021-01-06 DIAGNOSIS — Z1231 Encounter for screening mammogram for malignant neoplasm of breast: Secondary | ICD-10-CM

## 2021-01-13 ENCOUNTER — Ambulatory Visit (HOSPITAL_COMMUNITY)
Admission: RE | Admit: 2021-01-13 | Discharge: 2021-01-13 | Disposition: A | Discharge status: Home / Self Care | Payer: Medicare Other | Source: Ambulatory Visit | Attending: Nurse Practitioner | Admitting: Nurse Practitioner

## 2021-01-13 ENCOUNTER — Other Ambulatory Visit: Payer: Self-pay

## 2021-01-13 DIAGNOSIS — Z1231 Encounter for screening mammogram for malignant neoplasm of breast: Secondary | ICD-10-CM | POA: Diagnosis not present

## 2021-04-06 ENCOUNTER — Ambulatory Visit: Payer: Medicare Other | Admitting: Gastroenterology

## 2021-07-21 ENCOUNTER — Encounter: Payer: Self-pay | Admitting: Emergency Medicine

## 2021-07-21 ENCOUNTER — Other Ambulatory Visit: Payer: Self-pay

## 2021-07-21 ENCOUNTER — Ambulatory Visit
Admission: EM | Admit: 2021-07-21 | Discharge: 2021-07-21 | Disposition: A | Discharge status: Home / Self Care | Payer: Medicare Other | Attending: Emergency Medicine | Admitting: Emergency Medicine

## 2021-07-21 DIAGNOSIS — J069 Acute upper respiratory infection, unspecified: Secondary | ICD-10-CM | POA: Diagnosis not present

## 2021-07-21 MED ORDER — ALBUTEROL SULFATE HFA 108 (90 BASE) MCG/ACT IN AERS: 1.0000 | INHALATION_SPRAY | RESPIRATORY_TRACT | 0 refills | Status: DC | PRN

## 2021-07-21 MED ORDER — AEROCHAMBER PLUS MISC: 2 refills | Status: DC

## 2021-07-21 NOTE — ED Provider Notes (Signed)
HPI  SUBJECTIVE:  Cheryl Gutierrez is a 65 y.o. female who presents with 4 days of a cough that is occasionally productive of clear mucus, sore throat,.  She reports laryngitis and wheezing starting this morning.  No fevers, body aches, headaches, nasal congestion, rhinorrhea, postnasal drip, loss of sense of smell or taste, shortness of breath, nausea, vomiting, diarrhea, abdominal pain.  She was exposed to COVID 2 weeks ago.  No strep or flu exposure.  She got the J&J vaccine.  She did not get this years flu vaccine.  She states that she is unable to sleep at night secondary to the cough.  She denies GERD symptoms.  No antibiotics in the past month.  No antipyretic in the past 6 hours.  She has tried ibuprofen and cough drops.  Cough drops help.  No aggravating factors.  She has a past medical history of GERD which she states is not bothering her, diet-controlled diabetes, hypercholesterolemia.  No history of pulmonary disease, smoking.  CXK:GYJE, Dhruv B, MD   Past Medical History:  Diagnosis Date   Diabetes mellitus without complication ([place])    Controlled with diet   Hiatal hernia    Hyperplastic colon polyp    Reflux esophagitis    erosive    Past Surgical History:  Procedure Laterality Date   ABDOMINAL HYSTERECTOMY     COLONOSCOPY  07/29/2007   Dr. Gala Romney- anal papillae, o/w normal rectum, pedunculated polyp,mic sigmoid colon, hyperplastic polyp on bx, the remainder of the colonic mucosa appeared normal   ESOPHAGOGASTRODUODENOSCOPY N/A 03/31/2013   Dr. Raliegh Scarlet reflux esophagitis, hiatal hernia, gastric erosions, gastric polyp.bx= mild chronic inactive gastritis, fundic gland polyp    Family History  Problem Relation Age of Onset   Depression Mother    Anesthesia problems Neg Hx    Broken bones Neg Hx    Cancer Neg Hx    Clotting disorder Neg Hx    Collagen disease Neg Hx    Diabetes Neg Hx    Dislocations Neg Hx    Osteoporosis Neg Hx    Rheumatologic disease Neg Hx     Scoliosis Neg Hx    Severe sprains Neg Hx     Social History   Tobacco Use   Smoking status: Never   Smokeless tobacco: Never  Vaping Use   Vaping Use: Never used  Substance Use Topics   Alcohol use: No   Drug use: No    No current facility-administered medications for this encounter.  Current Outpatient Medications:    albuterol (VENTOLIN HFA) 108 (90 Base) MCG/ACT inhaler, Inhale 1-2 puffs into the lungs every 4 (four) hours as needed for wheezing or shortness of breath., Disp: 1 each, Rfl: 0   Spacer/Aero-Holding Chambers (AEROCHAMBER PLUS) inhaler, Use with inhaler, Disp: 1 each, Rfl: 2   atorvastatin (LIPITOR) 20 MG tablet, Take 20 mg by mouth daily., Disp: , Rfl: 4   buPROPion (WELLBUTRIN XL) 150 MG 24 hr tablet, Take 150 mg by mouth daily. (Patient not taking: Reported on 07/21/2021), Disp: , Rfl: 1   cholecalciferol (VITAMIN D) 1000 UNITS tablet, Take 1,000 Units by mouth daily. Reported on 11/16/2015, Disp: , Rfl:    pantoprazole (PROTONIX) 40 MG tablet, Take 40 mg by mouth every morning., Disp: , Rfl:    PARoxetine (PAXIL) 40 MG tablet, Take 40 mg by mouth every morning.  , Disp: , Rfl:   No Known Allergies   ROS  As noted in HPI.   Physical Exam  BP  123/82 (BP Location: Right Arm)   Pulse 92   Temp 99.4 F (37.4 C)   Resp 18   SpO2 94%   Constitutional: Well developed, well nourished, no acute distress Eyes:  EOMI, conjunctiva normal bilaterally HENT: Normocephalic, atraumatic,mucus membranes moist.  Nasal congestion.  No sinus tenderness.  Normal tonsils, pharynx.  Uvula midline.  No postnasal drip Neck: Positive cervical lymphadenopathy Respiratory: Normal inspiratory effort, lungs clear bilaterally Cardiovascular: Normal rate regular rhythm, no murmurs rubs or gallops GI: nondistended skin: No rash, skin intact Musculoskeletal: no deformities Neurologic: Alert & oriented x 3, no focal neuro deficits Psychiatric: Speech and behavior  appropriate   ED Course   Medications - No data to display  No orders of the defined types were placed in this encounter.   No results found for this or any previous visit (from the past 24 hour(s)). No results found.  ED Clinical Impression  1. Viral URI      ED Assessment/Plan  Presentation consistent with a URI.  Unfortunately, she is out of the treatment window for Tamiflu, and will be out of the treatment window for COVID by the time labs are resulted thus it was not sent. Home with an albuterol inhaler and spacer in case of wheezing gets worse.  She declines a prescription of Tessalon.  Follow-up with PMD as needed  Discussed medical decision making, treatment plan and plan for follow-up with patient.  She agrees with plan.  Meds ordered this encounter  Medications   albuterol (VENTOLIN HFA) 108 (90 Base) MCG/ACT inhaler    Sig: Inhale 1-2 puffs into the lungs every 4 (four) hours as needed for wheezing or shortness of breath.    Dispense:  1 each    Refill:  0   Spacer/Aero-Holding Chambers (AEROCHAMBER PLUS) inhaler    Sig: Use with inhaler    Dispense:  1 each    Refill:  2    Please educate patient on use      *This clinic note was created using Dragon dictation software. Therefore, there may be occasional mistakes despite careful proofreading.  ?    Melynda Ripple, MD 07/22/21 0800

## 2021-07-21 NOTE — Discharge Instructions (Addendum)
2 puffs from albuterol inhaler every 4-6 hours as needed for coughing, wheezing, shortness of breath.  Continue cough drops.

## 2021-07-21 NOTE — ED Triage Notes (Signed)
Cough started Monday night.  Since then cough has worsened.  Patient has had throat drainage.  Reports hearing upper airway rattling.    Sister-in-law tested positive for covid.  Patient was around her 2 weeks ago

## 2021-09-08 ENCOUNTER — Encounter: Payer: Self-pay | Admitting: Orthopaedic Surgery

## 2021-09-08 ENCOUNTER — Other Ambulatory Visit: Payer: Self-pay

## 2021-09-08 ENCOUNTER — Ambulatory Visit (INDEPENDENT_AMBULATORY_CARE_PROVIDER_SITE_OTHER): Payer: Medicare Other

## 2021-09-08 ENCOUNTER — Ambulatory Visit (INDEPENDENT_AMBULATORY_CARE_PROVIDER_SITE_OTHER): Payer: Medicare Other | Admitting: Orthopaedic Surgery

## 2021-09-08 VITALS — Ht 64.0 in | Wt 162.0 lb

## 2021-09-08 DIAGNOSIS — M654 Radial styloid tenosynovitis [de Quervain]: Secondary | ICD-10-CM

## 2021-09-08 DIAGNOSIS — M79642 Pain in left hand: Secondary | ICD-10-CM

## 2021-09-08 MED ORDER — LIDOCAINE HCL 1 % IJ SOLN
1.0000 mL | INTRAMUSCULAR | Status: AC | PRN
  Administered 2021-09-08: 1 mL

## 2021-09-08 MED ORDER — BUPIVACAINE HCL 0.25 % IJ SOLN
1.0000 mL | INTRAMUSCULAR | Status: AC | PRN
  Administered 2021-09-08: 1 mL

## 2021-09-08 MED ORDER — METHYLPREDNISOLONE ACETATE 40 MG/ML IJ SUSP
40.0000 mg | INTRAMUSCULAR | Status: AC | PRN
  Administered 2021-09-08: 40 mg

## 2021-09-08 NOTE — Progress Notes (Signed)
Office Visit Note   Patient: Cheryl Gutierrez           Date of Birth: 07/31/1956           MRN: [id number] Visit Date: 09/08/2021              Requested by: Glenda Chroman, MD [place] [id number] PCP: Glenda Chroman, MD   Assessment & Plan: Visit Diagnoses:  1. Pain in left hand   2. Tendinitis, de Quervain's     Plan: Injection performed left wrist first dorsal compartment.  She tolerated the injection fairly well.  We will place her in a thumb gutter splint and recheck her in 3 weeks.  We can obtain 2 view x-rays left shoulder and 2 view x-rays cervical spine on return.  Follow-Up Instructions: Return in about 3 weeks (around 09/29/2021).   Orders:  Orders Placed This Encounter  Procedures   Hand/UE Inj: L extensor compartment 1   XR Hand Complete Left   No orders of the defined types were placed in this encounter.     Procedures: Hand/UE Inj: L extensor compartment 1 for de Quervain's tenosynovitis on 09/08/2021 9:20 AM Medications: 1 mL lidocaine 1 %; 1 mL bupivacaine 0.25 %; 40 mg methylPREDNISolone acetate 40 MG/ML     Clinical Data: No additional findings.   Subjective: Chief Complaint  Patient presents with   Left Hand - Pain   Left Shoulder - Pain   Right Shoulder - Pain   Right Knee - Pain    HPI 66 year old female had previous 2019 shoulder arthroscopy biceps tenodesis rotator cuff repair by me on the right shoulder is seen with left nondominant thumb pain.  Pain with gripping squeezing.  She is also had some discomfort with her knees in the past.  Shoulders bother her she has problems flexing her bra or reaching behind her humans putting her bra around after a can of front is painful.  Sometimes she had 7 steps into it.  She denies numbness or tingling in her hands she feels like her shoulders feel tight pain radiates toward the biceps region.  Left wrist is most painful and she states she is having great trouble using her hand and  has been present for 2 months.  Medications from her PCP has not been helpful.  Review of Systems all other systems noncontributory to HPI.   Objective: Vital Signs: Ht 5\' 4"  (1.626 m)    Wt 162 lb (73.5 kg)    BMI 27.81 kg/m   Physical Exam Constitutional:      Appearance: She is well-developed.  HENT:     Head: Normocephalic.     Right Ear: External ear normal.     Left Ear: External ear normal. There is no impacted cerumen.  Eyes:     Pupils: Pupils are equal, round, and reactive to light.  Neck:     Thyroid: No thyromegaly.     Trachea: No tracheal deviation.  Cardiovascular:     Rate and Rhythm: Normal rate.  Pulmonary:     Effort: Pulmonary effort is normal.  Abdominal:     Palpations: Abdomen is soft.  Musculoskeletal:     Cervical back: No rigidity.  Skin:    General: Skin is warm and dry.  Neurological:     Mental Status: She is alert and oriented to person, place, and time.  Psychiatric:        Behavior: Behavior normal.  Ortho Exam well-healed right shoulder rotator cuff repair scar.  No tenderness biceps tendon no distal biceps migration.  Negative impingement she can get both arms overhead with minimal discomfort.  Exquisite tenderness over the left wrist first dorsal compartment positive Finkelstein test negative Tinel's negative carpal compression test.  Negative Spurling no brachial plexus tenderness.  Specialty Comments:  No specialty comments available.  Imaging: No results found.   PMFS History: Patient Active Problem List   Diagnosis Date Noted   Tendinitis, de Quervain's 09/08/2021   Biceps tendinosis of left shoulder 09/26/2017   Arthralgia of acromioclavicular joint 05/25/2014   GERD (gastroesophageal reflux disease) 05/08/2013   Elevated liver enzymes 05/08/2013   Abdominal pain 03/25/2013   Rotator cuff syndrome of left shoulder 07/31/2011   Past Medical History:  Diagnosis Date   Diabetes mellitus without complication  ([place])    Controlled with diet   Hiatal hernia    Hyperplastic colon polyp    Reflux esophagitis    erosive    Family History  Problem Relation Age of Onset   Depression Mother    Anesthesia problems Neg Hx    Broken bones Neg Hx    Cancer Neg Hx    Clotting disorder Neg Hx    Collagen disease Neg Hx    Diabetes Neg Hx    Dislocations Neg Hx    Osteoporosis Neg Hx    Rheumatologic disease Neg Hx    Scoliosis Neg Hx    Severe sprains Neg Hx     Past Surgical History:  Procedure Laterality Date   ABDOMINAL HYSTERECTOMY     COLONOSCOPY  07/29/2007   Dr. Gala Romney- anal papillae, o/w normal rectum, pedunculated polyp,mic sigmoid colon, hyperplastic polyp on bx, the remainder of the colonic mucosa appeared normal   ESOPHAGOGASTRODUODENOSCOPY N/A 03/31/2013   Dr. Raliegh Scarlet reflux esophagitis, hiatal hernia, gastric erosions, gastric polyp.bx= mild chronic inactive gastritis, fundic gland polyp   Social History   Occupational History   Occupation: clerical  Tobacco Use   Smoking status: Never   Smokeless tobacco: Never  Vaping Use   Vaping Use: Never used  Substance and Sexual Activity   Alcohol use: No   Drug use: No   Sexual activity: Not on file

## 2021-09-26 ENCOUNTER — Ambulatory Visit (INDEPENDENT_AMBULATORY_CARE_PROVIDER_SITE_OTHER): Payer: Medicare Other

## 2021-09-26 ENCOUNTER — Ambulatory Visit
Admission: EM | Admit: 2021-09-26 | Discharge: 2021-09-26 | Disposition: A | Discharge status: Home / Self Care | Payer: Medicare Other | Attending: Urgent Care | Admitting: Urgent Care

## 2021-09-26 ENCOUNTER — Other Ambulatory Visit: Payer: Self-pay

## 2021-09-26 DIAGNOSIS — U071 COVID-19: Secondary | ICD-10-CM | POA: Diagnosis not present

## 2021-09-26 DIAGNOSIS — R509 Fever, unspecified: Secondary | ICD-10-CM

## 2021-09-26 DIAGNOSIS — R059 Cough, unspecified: Secondary | ICD-10-CM | POA: Diagnosis not present

## 2021-09-26 MED ORDER — BENZONATATE 100 MG PO CAPS: 100.0000 mg | ORAL_CAPSULE | Freq: Three times a day (TID) | ORAL | 0 refills | Status: DC | PRN

## 2021-09-26 NOTE — ED Provider Notes (Signed)
[place]   MRN: [id number] DOB: February 02, 1956  Subjective:   Cheryl Gutierrez is a 66 y.o. female presenting for 10-day history of persistent malaise, fatigue, coughing, frontal headaches.  Patient was diagnosed with COVID-19 and completed her COVID antiviral medication molnupiravir.  While on this medication she stopped taking all of her regular medicines including Paxil.  She does remember that she actually does get headaches when she comes off of this medication abruptly.  No active chest pain, shortness of breath or wheezing.  She is not a smoker.  No history of respiratory disorders.  She is not using the albuterol inhaler.  No current facility-administered medications for this encounter.  Current Outpatient Medications:    albuterol (VENTOLIN HFA) 108 (90 Base) MCG/ACT inhaler, Inhale 1-2 puffs into the lungs every 4 (four) hours as needed for wheezing or shortness of breath., Disp: 1 each, Rfl: 0   atorvastatin (LIPITOR) 20 MG tablet, Take 20 mg by mouth daily., Disp: , Rfl: 4   buPROPion (WELLBUTRIN XL) 150 MG 24 hr tablet, Take 150 mg by mouth daily. (Patient not taking: Reported on 07/21/2021), Disp: , Rfl: 1   cholecalciferol (VITAMIN D) 1000 UNITS tablet, Take 1,000 Units by mouth daily. Reported on 11/16/2015, Disp: , Rfl:    pantoprazole (PROTONIX) 40 MG tablet, Take 40 mg by mouth every morning., Disp: , Rfl:    PARoxetine (PAXIL) 40 MG tablet, Take 40 mg by mouth every morning.  , Disp: , Rfl:    Spacer/Aero-Holding Chambers (AEROCHAMBER PLUS) inhaler, Use with inhaler, Disp: 1 each, Rfl: 2   No Known Allergies  Past Medical History:  Diagnosis Date   Diabetes mellitus without complication ([place])    Controlled with diet   Hiatal hernia    Hyperplastic colon polyp    Reflux esophagitis    erosive     Past Surgical History:  Procedure Laterality Date   ABDOMINAL HYSTERECTOMY     COLONOSCOPY  07/29/2007   Dr. Gala Romney- anal papillae, o/w normal rectum,  pedunculated polyp,mic sigmoid colon, hyperplastic polyp on bx, the remainder of the colonic mucosa appeared normal   ESOPHAGOGASTRODUODENOSCOPY N/A 03/31/2013   Dr. Raliegh Scarlet reflux esophagitis, hiatal hernia, gastric erosions, gastric polyp.bx= mild chronic inactive gastritis, fundic gland polyp    Family History  Problem Relation Age of Onset   Depression Mother    Anesthesia problems Neg Hx    Broken bones Neg Hx    Cancer Neg Hx    Clotting disorder Neg Hx    Collagen disease Neg Hx    Diabetes Neg Hx    Dislocations Neg Hx    Osteoporosis Neg Hx    Rheumatologic disease Neg Hx    Scoliosis Neg Hx    Severe sprains Neg Hx     Social History   Tobacco Use   Smoking status: Never   Smokeless tobacco: Never  Vaping Use   Vaping Use: Never used  Substance Use Topics   Alcohol use: No   Drug use: No    ROS   Objective:   Vitals: BP 107/71    Pulse (!) 113    Temp 99.1 F (37.3 C)    Resp (!) 22    SpO2 95% Comment: desats to 92  Physical Exam Constitutional:      General: She is not in acute distress.    Appearance: Normal appearance. She is well-developed. She is not ill-appearing, toxic-appearing or diaphoretic.  HENT:     Head: Normocephalic and atraumatic.  Nose: Nose normal.     Mouth/Throat:     Mouth: Mucous membranes are moist.  Eyes:     General: No scleral icterus.       Right eye: No discharge.        Left eye: No discharge.     Extraocular Movements: Extraocular movements intact.  Cardiovascular:     Rate and Rhythm: Normal rate.     Heart sounds: No murmur heard.   No friction rub. No gallop.  Pulmonary:     Effort: Pulmonary effort is normal. No respiratory distress.     Breath sounds: No stridor. No wheezing, rhonchi or rales.  Chest:     Chest wall: No tenderness.  Skin:    General: Skin is warm and dry.  Neurological:     General: No focal deficit present.     Mental Status: She is alert and oriented to person, place, and  time.  Psychiatric:        Mood and Affect: Mood normal.        Behavior: Behavior normal.    DG Chest 2 View  Result Date: 09/26/2021 CLINICAL DATA:  Ten days post COVID, cough, fever EXAM: CHEST - 2 VIEW COMPARISON:  None. FINDINGS: The cardiomediastinal silhouette is normal There is no focal consolidation or pulmonary edema. There is no pleural effusion or pneumothorax. There is no acute osseous abnormality. IMPRESSION: No radiographic evidence of acute cardiopulmonary process. Electronically Signed   By: Valetta Mole M.D.   On: 09/26/2021 19:26     Assessment and Plan :   PDMP not reviewed this encounter.  1. COVID-19    Low suspicion for an acute encephalopathy.  Recommended that she restart all her regular medications.  Offered her supportive care.  She declined a prednisone course and cough syrup.  She is agreeable to doing benzonatate. Counseled patient on potential for adverse effects with medications prescribed/recommended today, ER and return-to-clinic precautions discussed, patient verbalized understanding.    Jaynee Eagles, [place] 09/26/21 1947

## 2021-09-26 NOTE — ED Triage Notes (Signed)
Pt presents with c/o cough that isn't improving, covid diagnosis 10 days ago has completed antiviarals

## 2021-10-06 ENCOUNTER — Ambulatory Visit (INDEPENDENT_AMBULATORY_CARE_PROVIDER_SITE_OTHER): Payer: Medicare Other | Admitting: Orthopaedic Surgery

## 2021-10-06 ENCOUNTER — Encounter: Payer: Self-pay | Admitting: Orthopaedic Surgery

## 2021-10-06 ENCOUNTER — Ambulatory Visit (INDEPENDENT_AMBULATORY_CARE_PROVIDER_SITE_OTHER): Payer: Medicare Other

## 2021-10-06 ENCOUNTER — Other Ambulatory Visit: Payer: Self-pay

## 2021-10-06 ENCOUNTER — Ambulatory Visit: Payer: Self-pay

## 2021-10-06 VITALS — Ht 64.0 in | Wt 162.0 lb

## 2021-10-06 DIAGNOSIS — G8929 Other chronic pain: Secondary | ICD-10-CM

## 2021-10-06 DIAGNOSIS — M25511 Pain in right shoulder: Secondary | ICD-10-CM

## 2021-10-06 DIAGNOSIS — M25512 Pain in left shoulder: Secondary | ICD-10-CM

## 2021-10-06 DIAGNOSIS — M75102 Unspecified rotator cuff tear or rupture of left shoulder, not specified as traumatic: Secondary | ICD-10-CM | POA: Diagnosis not present

## 2021-10-06 DIAGNOSIS — M25561 Pain in right knee: Secondary | ICD-10-CM

## 2021-10-06 NOTE — Progress Notes (Signed)
Office Visit Note   Patient: Cheryl Gutierrez           Date of Birth: 12/28/1955           MRN: [id number] Visit Date: 10/06/2021              Requested by: Glenda Chroman, MD [place],  [place] [address] PCP: Glenda Chroman, MD   Assessment & Plan: Visit Diagnoses:  1. Chronic pain of both shoulders   2. Chronic pain of right knee   3. Right knee pain, unspecified chronicity   4. Rotator cuff syndrome of left shoulder   5.      Post right shoulder rotator cuff repair and biceps tenodesis.  Plan: Patient continue some stretching she use some bands for shoulder strengthening as she was instructed in physical therapy for her right shoulder.  She uses a pad when she gets down on her knees when she gardens.  She has only mild knee degenerative changes.  If she develops locking she can return.  Rotator cuff strength is good on both sides no evidence of recurrent rotator cuff tear of her right shoulder.  Follow-up as needed.  Follow-Up Instructions: Return if symptoms worsen or fail to improve.   Orders:  Orders Placed This Encounter  Procedures   XR Shoulder Left   XR Shoulder Right   XR KNEE 3 VIEW RIGHT   XR Cervical Spine 2 or 3 views   No orders of the defined types were placed in this encounter.     Procedures: No procedures performed   Clinical Data: No additional findings.   Subjective: Chief Complaint  Patient presents with   Right Shoulder - Pain, Follow-up   Left Shoulder - Pain, Follow-up   Right Knee - Follow-up, Pain   Left Hand - Follow-up, Pain    HPI 66 year old female returns with problems with right knee more than left knee with occasional catching in her knee no true locking.  She has problems if she tries to get down on her niece's bothers her when she does gardening and she has to bring the pad out foot down and then put her knee on it.  She goes up steps fine with the right knee but has problems going down.  Also has some discomfort with the  right shoulder with occasional pain with abduction or flexion in the right shoulder which had previous rotator cuff repair and biceps tenodesis.  She states is certainly better than it was before surgery.  Opposite left shoulder bothers her to some degree but not as much.  Review of Systems reviewed updated unchanged   Objective: Vital Signs: Ht 5\' 4"  (1.626 m)    Wt 162 lb (73.5 kg)    BMI 27.81 kg/m   Physical Exam Constitutional:      Appearance: She is well-developed.  HENT:     Head: Normocephalic.     Right Ear: External ear normal.     Left Ear: External ear normal. There is no impacted cerumen.  Eyes:     Pupils: Pupils are equal, round, and reactive to light.  Neck:     Thyroid: No thyromegaly.     Trachea: No tracheal deviation.  Cardiovascular:     Rate and Rhythm: Normal rate.  Pulmonary:     Effort: Pulmonary effort is normal.  Abdominal:     Palpations: Abdomen is soft.  Musculoskeletal:     Cervical back: No rigidity.  Skin:  General: Skin is warm and dry.  Neurological:     Mental Status: She is alert and oriented to person, place, and time.  Psychiatric:        Behavior: Behavior normal.    Ortho Exam negative drop arm test right mild tenderness over the biceps tenodesis right shoulder.  Some tenderness over the left negative drop arm test on the left mildly positive impingement on the left reflexes are 2+ no brachial plexus tenderness good cervical range of motion.  No lower extremity clonus.  Mild crepitus with knee extension more on the right than left knee collateral ligaments are stable full extension full flexion.  Distal pulses are intact.  Specialty Comments:  No specialty comments available.  Imaging: XR Cervical Spine 2 or 3 views  Result Date: 10/06/2021 AP lateral cervical spine images are obtained and reviewed.  This shows facet degenerative changes at C5-6 on the left and C6-7 on the right.  Some disc base narrowing and spurring at C6-7  without spondylolisthesis. Impression: Mild facet degenerative changes and endplate spurring at the as described above C5-6 C6-7.  XR KNEE 3 VIEW RIGHT  Result Date: 10/06/2021 Three-view x-rays right knee obtained and reviewed including standing AP both knees.  This shows no significant joint space narrowing no acute bony changes.  Minimal lateral patellofemoral spurring.  No chondrocalcinosis. Impression: Negative for significant degenerative arthritis right knee.  XR Shoulder Left  Result Date: 10/06/2021 Three-view x-rays left shoulder obtained and reviewed this shows minimal glenohumeral spurring no high riding of the head no rotator cuff calcification mild acromioclavicular degenerative changes. Impression: Mild left shoulder OA.  No acute bony changes.  XR Shoulder Right  Result Date: 10/06/2021 Three-view x-rays right shoulder obtained and reviewed this shows previous ultra fix anchor in the greater tuberosity single anchor.  Mild glenohumeral spurring.  Changes from biceps tenodesis.  Mild narrowing of the subacromial space.  No rotator cuff calcification. Impression: Mild degenerative changes right shoulder postop changes from rotator cuff repair.    PMFS History: Patient Active Problem List   Diagnosis Date Noted   Pain in right knee 10/06/2021   Tendinitis, de Quervain's 09/08/2021   Biceps tendinosis of left shoulder 09/26/2017   Arthralgia of acromioclavicular joint 05/25/2014   GERD (gastroesophageal reflux disease) 05/08/2013   Elevated liver enzymes 05/08/2013   Abdominal pain 03/25/2013   Rotator cuff syndrome of left shoulder 07/31/2011   Past Medical History:  Diagnosis Date   Diabetes mellitus without complication ([place])    Controlled with diet   Hiatal hernia    Hyperplastic colon polyp    Reflux esophagitis    erosive    Family History  Problem Relation Age of Onset   Depression Mother    Anesthesia problems Neg Hx    Broken bones Neg Hx    Cancer Neg Hx     Clotting disorder Neg Hx    Collagen disease Neg Hx    Diabetes Neg Hx    Dislocations Neg Hx    Osteoporosis Neg Hx    Rheumatologic disease Neg Hx    Scoliosis Neg Hx    Severe sprains Neg Hx     Past Surgical History:  Procedure Laterality Date   ABDOMINAL HYSTERECTOMY     COLONOSCOPY  07/29/2007   Dr. Gala Romney- anal papillae, o/w normal rectum, pedunculated polyp,mic sigmoid colon, hyperplastic polyp on bx, the remainder of the colonic mucosa appeared normal   ESOPHAGOGASTRODUODENOSCOPY N/A 03/31/2013   Dr. Raliegh Scarlet reflux esophagitis, hiatal hernia,  gastric erosions, gastric polyp.bx= mild chronic inactive gastritis, fundic gland polyp   Social History   Occupational History   Occupation: clerical  Tobacco Use   Smoking status: Never   Smokeless tobacco: Never  Vaping Use   Vaping Use: Never used  Substance and Sexual Activity   Alcohol use: No   Drug use: No   Sexual activity: Not on file

## 2021-11-15 ENCOUNTER — Other Ambulatory Visit: Payer: Self-pay

## 2021-11-15 ENCOUNTER — Encounter: Payer: Self-pay | Admitting: Emergency Medicine

## 2021-11-15 ENCOUNTER — Ambulatory Visit
Admission: EM | Admit: 2021-11-15 | Discharge: 2021-11-15 | Disposition: A | Discharge status: Home / Self Care | Payer: Medicare Other | Attending: Family Medicine | Admitting: Family Medicine

## 2021-11-15 DIAGNOSIS — J069 Acute upper respiratory infection, unspecified: Secondary | ICD-10-CM | POA: Diagnosis not present

## 2021-11-15 MED ORDER — PREDNISONE 10 MG PO TABS: 20.0000 mg | ORAL_TABLET | Freq: Every day | ORAL | 0 refills | Status: DC

## 2021-11-15 MED ORDER — PROMETHAZINE-DM 6.25-15 MG/5ML PO SYRP: 5.0000 mL | ORAL_SOLUTION | Freq: Four times a day (QID) | ORAL | 0 refills | Status: DC | PRN

## 2021-11-15 NOTE — ED Provider Notes (Signed)
?[facility] ? ? ? ?CSN: [id number] ?Arrival date & time: 11/15/21  3875 ? ? ?  ? ?History   ?Chief Complaint ?Chief Complaint  ?Patient presents with  ? Cough  ? ? ?HPI ?Cheryl Gutierrez is a 66 y.o. female.  ? ?Several day history of cough, sneezing, runny nose, fatigue.  Denies fever, chills, chest pain, shortness of breath, abdominal pain, nausea vomiting or diarrhea.  So far not trying anything over-the-counter for symptoms.  No known history of pertinent chronic medical problems.  Husband was sick with similar symptoms last week.  Negative home COVID test yesterday. ? ? ?Past Medical History:  ?Diagnosis Date  ? Diabetes mellitus without complication ([place])   ? Controlled with diet  ? Hiatal hernia   ? Hyperplastic colon polyp   ? Reflux esophagitis   ? erosive  ? ? ?Patient Active Problem List  ? Diagnosis Date Noted  ? Pain in right knee 10/06/2021  ? Tendinitis, de Quervain's 09/08/2021  ? Biceps tendinosis of left shoulder 09/26/2017  ? Arthralgia of acromioclavicular joint 05/25/2014  ? GERD (gastroesophageal reflux disease) 05/08/2013  ? Elevated liver enzymes 05/08/2013  ? Abdominal pain 03/25/2013  ? Rotator cuff syndrome of left shoulder 07/31/2011  ? ? ?Past Surgical History:  ?Procedure Laterality Date  ? ABDOMINAL HYSTERECTOMY    ? COLONOSCOPY  07/29/2007  ? Dr. Gala Romney- anal papillae, o/w normal rectum, pedunculated polyp,mic sigmoid colon, hyperplastic polyp on bx, the remainder of the colonic mucosa appeared normal  ? ESOPHAGOGASTRODUODENOSCOPY N/A 03/31/2013  ? Dr. Raliegh Scarlet reflux esophagitis, hiatal hernia, gastric erosions, gastric polyp.bx= mild chronic inactive gastritis, fundic gland polyp  ? ? ?OB History   ?No obstetric history on file. ?  ? ? ? ?Home Medications   ? ?Prior to Admission medications   ?Medication Sig Start Date End Date Taking? Authorizing Provider  ?predniSONE (DELTASONE) 10 MG tablet Take 2 tablets (20 mg total) by mouth daily. 11/15/21  Yes Volney American, PA-C  ?promethazine-dextromethorphan (PROMETHAZINE-DM) 6.25-15 MG/5ML syrup Take 5 mLs by mouth 4 (four) times daily as needed. 11/15/21  Yes Volney American, PA-C  ?albuterol (VENTOLIN HFA) 108 (90 Base) MCG/ACT inhaler Inhale 1-2 puffs into the lungs every 4 (four) hours as needed for wheezing or shortness of breath. 07/21/21   Melynda Ripple, MD  ?atorvastatin (LIPITOR) 20 MG tablet Take 20 mg by mouth daily. 05/17/17   [provider]  ?benzonatate (TESSALON) 100 MG capsule Take 1 capsule (100 mg total) by mouth 3 (three) times daily as needed for cough. 09/26/21   Jaynee Eagles, PA-C  ?cholecalciferol (VITAMIN D) 1000 UNITS tablet Take 1,000 Units by mouth daily. Reported on 11/16/2015    [provider]  ?pantoprazole (PROTONIX) 40 MG tablet Take 40 mg by mouth every morning. 06/28/21   [provider]  ?PARoxetine (PAXIL) 40 MG tablet Take 40 mg by mouth every morning.      [provider]  ?Spacer/Aero-Holding Chambers (AEROCHAMBER PLUS) inhaler Use with inhaler 07/21/21   Melynda Ripple, MD  ? ? ?Family History ?Family History  ?Problem Relation Age of Onset  ? Depression Mother   ? Anesthesia problems Neg Hx   ? Broken bones Neg Hx   ? Cancer Neg Hx   ? Clotting disorder Neg Hx   ? Collagen disease Neg Hx   ? Diabetes Neg Hx   ? Dislocations Neg Hx   ? Osteoporosis Neg Hx   ? Rheumatologic disease Neg Hx   ?  Scoliosis Neg Hx   ? Severe sprains Neg Hx   ? ? ?Social History ?Social History  ? ?Tobacco Use  ? Smoking status: Never  ? Smokeless tobacco: Never  ?Vaping Use  ? Vaping Use: Never used  ?Substance Use Topics  ? Alcohol use: No  ? Drug use: No  ? ? ? ?Allergies   ?Patient has no known allergies. ? ? ?Review of Systems ?Review of Systems ?Per HPI ? ?Physical Exam ?Triage Vital Signs ?ED Triage Vitals [11/15/21 0854]  ?Enc Vitals Group  ?   BP 124/82  ?   Pulse Rate 96  ?   Resp 18  ?   Temp 99.1 ?F (37.3 ?C)  ?   Temp Source Oral  ?   SpO2 94 %  ?    Weight 160 lb (72.6 kg)  ?   Height '5\' 4"'$  (1.626 m)  ?   Head Circumference   ?   Peak Flow   ?   Pain Score 6  ?   Pain Loc   ?   Pain Edu?   ?   Excl. in [place]?   ? ?No data found. ? ?Updated Vital Signs ?BP 124/82 (BP Location: Right Arm)   Pulse 96   Temp 99.1 ?F (37.3 ?C) (Oral)   Resp 18   Ht '5\' 4"'$  (1.626 m)   Wt 160 lb (72.6 kg)   SpO2 94%   BMI 27.46 kg/m?  ? ?Visual Acuity ?Right Eye Distance:   ?Left Eye Distance:   ?Bilateral Distance:   ? ?Right Eye Near:   ?Left Eye Near:    ?Bilateral Near:    ? ?Physical Exam ?Vitals and nursing note reviewed.  ?Constitutional:   ?   Appearance: Normal appearance. She is not ill-appearing.  ?HENT:  ?   Head: Atraumatic.  ?   Right Ear: Tympanic membrane and external ear normal.  ?   Left Ear: Tympanic membrane and external ear normal.  ?   Nose: Rhinorrhea present.  ?   Mouth/Throat:  ?   Mouth: Mucous membranes are moist.  ?   Pharynx: Posterior oropharyngeal erythema present.  ?Eyes:  ?   Extraocular Movements: Extraocular movements intact.  ?   Conjunctiva/sclera: Conjunctivae normal.  ?Cardiovascular:  ?   Rate and Rhythm: Normal rate and regular rhythm.  ?   Heart sounds: Normal heart sounds.  ?Pulmonary:  ?   Effort: Pulmonary effort is normal.  ?   Breath sounds: Normal breath sounds. No wheezing or rales.  ?Musculoskeletal:     ?   General: Normal range of motion.  ?   Cervical back: Normal range of motion and neck supple.  ?Skin: ?   General: Skin is warm and dry.  ?Neurological:  ?   Mental Status: She is alert and oriented to person, place, and time.  ?Psychiatric:     ?   Mood and Affect: Mood normal.     ?   Thought Content: Thought content normal.     ?   Judgment: Judgment normal.  ? ? ? ?UC Treatments / Results  ?Labs ?(all labs ordered are listed, but only abnormal results are displayed) ?Labs Reviewed - No data to display ? ?EKG ? ? ?Radiology ?No results found. ? ?Procedures ?Procedures (including critical care time) ? ?Medications Ordered in  UC ?Medications - No data to display ? ?Initial Impression / Assessment and Plan / UC Course  ?I have reviewed the triage vital signs and  the nursing notes. ? ?Pertinent labs & imaging results that were available during my care of the patient were reviewed by me and considered in my medical decision making (see chart for details). ? ?  ? ?Vital signs overall benign and reassuring, exam reassuring as well.  Suspect viral upper respiratory infection.  Declines COVID and flu testing, home COVID test was negative.  Treat with Phenergan DM, short course of prednisone, supportive care and return precautions given. ? ?Final Clinical Impressions(s) / UC Diagnoses  ? ?Final diagnoses:  ?Viral URI with cough  ? ?Discharge Instructions   ?None ?  ? ?ED Prescriptions   ? ? Medication Sig Dispense Auth. Provider  ? promethazine-dextromethorphan (PROMETHAZINE-DM) 6.25-15 MG/5ML syrup Take 5 mLs by mouth 4 (four) times daily as needed. 100 mL Volney American, PA-C  ? predniSONE (DELTASONE) 10 MG tablet Take 2 tablets (20 mg total) by mouth daily. 6 tablet Volney American, [place]  ? ?  ? ?PDMP not reviewed this encounter. ?  ?Volney American, PA-C ?11/15/21 9509 ? ?

## 2021-11-15 NOTE — ED Triage Notes (Signed)
Pt reports cough, sneezing, runny nose for last several days. Pt denies any known fevers and reports home covid test was negative.  ?

## 2021-12-29 ENCOUNTER — Encounter: Payer: Self-pay | Admitting: Orthopaedic Surgery

## 2021-12-29 ENCOUNTER — Ambulatory Visit (INDEPENDENT_AMBULATORY_CARE_PROVIDER_SITE_OTHER): Payer: Medicare Other | Admitting: Orthopaedic Surgery

## 2021-12-29 ENCOUNTER — Ambulatory Visit (INDEPENDENT_AMBULATORY_CARE_PROVIDER_SITE_OTHER): Payer: Medicare Other

## 2021-12-29 VITALS — Ht 62.0 in | Wt 152.0 lb

## 2021-12-29 DIAGNOSIS — M25552 Pain in left hip: Secondary | ICD-10-CM

## 2021-12-29 DIAGNOSIS — M7062 Trochanteric bursitis, left hip: Secondary | ICD-10-CM | POA: Diagnosis not present

## 2021-12-29 DIAGNOSIS — M654 Radial styloid tenosynovitis [de Quervain]: Secondary | ICD-10-CM | POA: Diagnosis not present

## 2021-12-29 MED ORDER — METHYLPREDNISOLONE ACETATE 40 MG/ML IJ SUSP
40.0000 mg | INTRAMUSCULAR | Status: AC | PRN
  Administered 2021-12-29: 40 mg via INTRA_ARTICULAR

## 2021-12-29 MED ORDER — LIDOCAINE HCL 1 % IJ SOLN
1.0000 mL | INTRAMUSCULAR | Status: AC | PRN
  Administered 2021-12-29: 1 mL

## 2021-12-29 MED ORDER — BUPIVACAINE HCL 0.25 % IJ SOLN
1.0000 mL | INTRAMUSCULAR | Status: AC | PRN
  Administered 2021-12-29: 1 mL via INTRA_ARTICULAR

## 2021-12-29 NOTE — Progress Notes (Signed)
Office Visit Note   Patient: Cheryl Gutierrez           Date of Birth: 07-16-1956           MRN: [id number] Visit Date: 12/29/2021              Requested by: Glenda Chroman, MD [place],  [place] [id number] PCP: Glenda Chroman, MD   Assessment & Plan: Visit Diagnoses:  1. Pain in left hip   2. Trochanteric bursitis, left hip   3. Tendinitis, de Quervain's            left  Plan: Left trochanteric injection performed.  She like to schedule outpatient surgery for her left Dequervain's stenosing tenosynovitis.  Plan outpatient left wrist first dorsal compartment release.  Trochanteric injection performed which she tolerated well.  Patient's risk of injury with anti-inflammatories topical creams more than 3 months of near constant splinting and previous injection in January 2023.  Decision for surgery made planned procedure discussed.  Questions were elicited and answered.  Follow-Up Instructions: No follow-ups on file.   Orders:  Orders Placed This Encounter  Procedures   Large Joint Inj: L greater trochanter   XR HIP UNILAT W OR W/O PELVIS 2-3 VIEWS LEFT   No orders of the defined types were placed in this encounter.     Procedures: Large Joint Inj: L greater trochanter on 12/29/2021 9:22 AM Details: lateral approach Medications: 1 mL lidocaine 1 %; 1 mL bupivacaine 0.25 %; 40 mg methylPREDNISolone acetate 40 MG/ML     Clinical Data: No additional findings.   Subjective: Chief Complaint  Patient presents with   Left Hip - Pain   Left Wrist - Pain    HPI 66 year old female returns with ongoing problems with left wrist first dorsal compartment tenosynovitis she has been wearing a splint on her thumb for more than 2 months.  Still has pain with squeezing.  She states the injection done in January 2023 did not help more than a day or so and she is ready for surgery to fix the problem.  Her other problem has been the left trochanteric pain.  She has known disc  degeneration.  Previous CT scan for abdominal pain showed near complete collapse L5-S1 disc space with endplate spurring.  She has had some back pain and has had pain over the trochanter no numbness or tingling but she states at times pain over the trochanter feels like sharp pain and feels like it was going to catch and she has to grab something to keep from falling.  Review of Systems past history of GE reflux.  Cardiac negative.   Objective: Vital Signs: Ht '5\' 2"'$  (1.575 m)   Wt 152 lb (68.9 kg)   BMI 27.80 kg/m   Physical Exam Constitutional:      Appearance: She is well-developed.  HENT:     Head: Normocephalic.     Right Ear: External ear normal.     Left Ear: External ear normal. There is no impacted cerumen.  Eyes:     Pupils: Pupils are equal, round, and reactive to light.  Neck:     Thyroid: No thyromegaly.     Trachea: No tracheal deviation.  Cardiovascular:     Rate and Rhythm: Normal rate.  Pulmonary:     Effort: Pulmonary effort is normal.  Abdominal:     Palpations: Abdomen is soft.  Musculoskeletal:     Cervical back: No rigidity.  Skin:  General: Skin is warm and dry.  Neurological:     Mental Status: She is alert and oriented to person, place, and time.  Psychiatric:        Behavior: Behavior normal.    Ortho Exam positive Finkelstein test exquisite tenderness of the first dorsal compartment.  She has been wearing a thumb gutter splint.  Good cervical range of motion no brachial plexus tenderness.  Positive sciatic notch tenderness on the left.  Pain with straight leg raising 90 degrees knee and ankle jerk are intact normal heel-toe gait.  Exquisite tenderness over the left greater trochanter.  Specialty Comments:  No specialty comments available.  Imaging: XR HIP UNILAT W OR W/O PELVIS 2-3 VIEWS LEFT  Result Date: 12/29/2021 AP pelvis frog-leg left hip obtained and reviewed this shows normal hip joints negative for acute or chronic changes.   Sacroiliac joints are normal. Impression:: Normal left hip radiographs.    PMFS History: Patient Active Problem List   Diagnosis Date Noted   Trochanteric bursitis, left hip 12/29/2021   Pain in right knee 10/06/2021   Tendinitis, de Quervain's 09/08/2021   Biceps tendinosis of left shoulder 09/26/2017   Arthralgia of acromioclavicular joint 05/25/2014   GERD (gastroesophageal reflux disease) 05/08/2013   Elevated liver enzymes 05/08/2013   Abdominal pain 03/25/2013   Rotator cuff syndrome of left shoulder 07/31/2011   Past Medical History:  Diagnosis Date   Diabetes mellitus without complication ([place])    Controlled with diet   Hiatal hernia    Hyperplastic colon polyp    Reflux esophagitis    erosive    Family History  Problem Relation Age of Onset   Depression Mother    Anesthesia problems Neg Hx    Broken bones Neg Hx    Cancer Neg Hx    Clotting disorder Neg Hx    Collagen disease Neg Hx    Diabetes Neg Hx    Dislocations Neg Hx    Osteoporosis Neg Hx    Rheumatologic disease Neg Hx    Scoliosis Neg Hx    Severe sprains Neg Hx     Past Surgical History:  Procedure Laterality Date   ABDOMINAL HYSTERECTOMY     COLONOSCOPY  07/29/2007   Dr. Gala Romney- anal papillae, o/w normal rectum, pedunculated polyp,mic sigmoid colon, hyperplastic polyp on bx, the remainder of the colonic mucosa appeared normal   ESOPHAGOGASTRODUODENOSCOPY N/A 03/31/2013   Dr. Raliegh Scarlet reflux esophagitis, hiatal hernia, gastric erosions, gastric polyp.bx= mild chronic inactive gastritis, fundic gland polyp   Social History   Occupational History   Occupation: clerical  Tobacco Use   Smoking status: Never   Smokeless tobacco: Never  Vaping Use   Vaping Use: Never used  Substance and Sexual Activity   Alcohol use: No   Drug use: No   Sexual activity: Not on file

## 2022-01-16 ENCOUNTER — Other Ambulatory Visit: Payer: Self-pay | Admitting: Orthopaedic Surgery

## 2022-01-16 DIAGNOSIS — M654 Radial styloid tenosynovitis [de Quervain]: Secondary | ICD-10-CM | POA: Diagnosis not present

## 2022-01-16 MED ORDER — HYDROCODONE-ACETAMINOPHEN 5-325 MG PO TABS: 1.0000 | ORAL_TABLET | Freq: Four times a day (QID) | ORAL | 0 refills | Status: DC | PRN

## 2022-01-26 ENCOUNTER — Ambulatory Visit (INDEPENDENT_AMBULATORY_CARE_PROVIDER_SITE_OTHER): Payer: Medicare Other | Admitting: Orthopaedic Surgery

## 2022-01-26 DIAGNOSIS — M654 Radial styloid tenosynovitis [de Quervain]: Secondary | ICD-10-CM

## 2022-01-26 NOTE — Progress Notes (Signed)
Follow-up Decore veins tenosynovitis release left wrist cyst incision with good.  Steri-Strips applied return 1 week for suture removal.  Removable Velcro splint applied.  She can remove it to wash her hands.

## 2022-02-02 ENCOUNTER — Ambulatory Visit (INDEPENDENT_AMBULATORY_CARE_PROVIDER_SITE_OTHER): Payer: Medicare Other | Admitting: Orthopaedic Surgery

## 2022-02-02 DIAGNOSIS — M654 Radial styloid tenosynovitis [de Quervain]: Secondary | ICD-10-CM

## 2022-02-23 ENCOUNTER — Ambulatory Visit (INDEPENDENT_AMBULATORY_CARE_PROVIDER_SITE_OTHER): Payer: Medicare Other | Admitting: Orthopaedic Surgery

## 2022-02-23 DIAGNOSIS — M654 Radial styloid tenosynovitis [de Quervain]: Secondary | ICD-10-CM

## 2022-02-23 NOTE — Progress Notes (Signed)
   Post-Op Visit Note   Patient: Cheryl Gutierrez           Date of Birth: [birth year]           MRN: [id number] Visit Date: 02/23/2022 PCP: Glenda Chroman, MD   Assessment & Plan: Postop first dorsal compartment wrist release.  Previously with dressing change she fainted.  She is doing well today states she uses her hand all the time occasionally has been wearing the splint mostly at night occasionally has some mild discomfort but the sharp stabbing electrical pain is gone.  Incisions well-healed.  Chief Complaint:  Chief Complaint  Patient presents with   Post-op Follow-up   Visit Diagnoses:  1. Tendinitis, de Quervain's     Plan: Discussed with her she can gradually resume normal activities which she is already done.  She can return if she is having any problems in a few months otherwise return as needed.  Follow-Up Instructions:  return PRN  Orders:  No orders of the defined types were placed in this encounter.  No orders of the defined types were placed in this encounter.   Imaging: No results found.  PMFS History: Patient Active Problem List   Diagnosis Date Noted   Trochanteric bursitis, left hip 12/29/2021   Pain in right knee 10/06/2021   Tendinitis, de Quervain's 09/08/2021   Biceps tendinosis of left shoulder 09/26/2017   Arthralgia of acromioclavicular joint 05/25/2014   GERD (gastroesophageal reflux disease) 05/08/2013   Elevated liver enzymes 05/08/2013   Abdominal pain 03/25/2013   Rotator cuff syndrome of left shoulder 07/31/2011   Past Medical History:  Diagnosis Date   Diabetes mellitus without complication ([place])    Controlled with diet   Hiatal hernia    Hyperplastic colon polyp    Reflux esophagitis    erosive    Family History  Problem Relation Age of Onset   Depression Mother    Anesthesia problems Neg Hx    Broken bones Neg Hx    Cancer Neg Hx    Clotting disorder Neg Hx    Collagen disease Neg Hx    Diabetes Neg Hx    Dislocations  Neg Hx    Osteoporosis Neg Hx    Rheumatologic disease Neg Hx    Scoliosis Neg Hx    Severe sprains Neg Hx     Past Surgical History:  Procedure Laterality Date   ABDOMINAL HYSTERECTOMY     COLONOSCOPY  07/29/2007   Dr. Gala Romney- anal papillae, o/w normal rectum, pedunculated polyp,mic sigmoid colon, hyperplastic polyp on bx, the remainder of the colonic mucosa appeared normal   ESOPHAGOGASTRODUODENOSCOPY N/A 03/31/2013   Dr. Raliegh Scarlet reflux esophagitis, hiatal hernia, gastric erosions, gastric polyp.bx= mild chronic inactive gastritis, fundic gland polyp   Social History   Occupational History   Occupation: clerical  Tobacco Use   Smoking status: Never   Smokeless tobacco: Never  Vaping Use   Vaping Use: Never used  Substance and Sexual Activity   Alcohol use: No   Drug use: No   Sexual activity: Not on file

## 2022-02-27 ENCOUNTER — Ambulatory Visit (INDEPENDENT_AMBULATORY_CARE_PROVIDER_SITE_OTHER): Payer: Medicare Other | Admitting: Dermatology

## 2022-02-27 DIAGNOSIS — Z1283 Encounter for screening for malignant neoplasm of skin: Secondary | ICD-10-CM

## 2022-03-04 ENCOUNTER — Other Ambulatory Visit: Payer: Self-pay

## 2022-03-04 ENCOUNTER — Emergency Department (HOSPITAL_COMMUNITY)
Admission: EM | Admit: 2022-03-04 | Discharge: 2022-03-04 | Disposition: A | Discharge status: Home / Self Care | Payer: Medicare Other | Attending: Emergency Medicine | Admitting: Emergency Medicine

## 2022-03-04 ENCOUNTER — Encounter (HOSPITAL_COMMUNITY): Payer: Self-pay | Admitting: *Deleted

## 2022-03-04 DIAGNOSIS — R5383 Other fatigue: Secondary | ICD-10-CM | POA: Insufficient documentation

## 2022-03-04 DIAGNOSIS — E860 Dehydration: Secondary | ICD-10-CM | POA: Diagnosis not present

## 2022-03-04 DIAGNOSIS — R55 Syncope and collapse: Secondary | ICD-10-CM | POA: Diagnosis present

## 2022-03-04 LAB — BASIC METABOLIC PANEL
Anion gap: 7 (ref 5–15)
BUN: 20 mg/dL (ref 8–23)
CO2: 24 mmol/L (ref 22–32)
Calcium: 8.5 mg/dL — ABNORMAL LOW (ref 8.9–10.3)
Chloride: 111 mmol/L (ref 98–111)
Creatinine, Ser: 0.94 mg/dL (ref 0.44–1.00)
GFR, Estimated: >60 mL/min (ref >60)
Glucose, Bld: 107 mg/dL — ABNORMAL HIGH (ref 70–99)
Potassium: 4 mmol/L (ref 3.5–5.1)
Sodium: 142 mmol/L (ref 135–145)

## 2022-03-04 LAB — URINALYSIS, ROUTINE W REFLEX MICROSCOPIC
Bacteria, UA: NONE SEEN
Bilirubin Urine: NEGATIVE
Glucose, UA: NEGATIVE mg/dL
Hgb urine dipstick: NEGATIVE
Ketones, ur: NEGATIVE mg/dL
Nitrite: NEGATIVE
Protein, ur: NEGATIVE mg/dL
Specific Gravity, Urine: 1.016 (ref 1.005–1.030)
pH: 7 (ref 5.0–8.0)

## 2022-03-04 LAB — CBC WITH DIFFERENTIAL/PLATELET
Abs Immature Granulocytes: 0.06 10*3/uL (ref 0.00–0.07)
Basophils Absolute: 0.1 10*3/uL (ref 0.0–0.1)
Basophils Relative: 0 %
Eosinophils Absolute: 0.1 10*3/uL (ref 0.0–0.5)
Eosinophils Relative: 1 %
HCT: 37.8 % (ref 36.0–46.0)
Hemoglobin: 12.8 g/dL (ref 12.0–15.0)
Immature Granulocytes: 1 %
Lymphocytes Relative: 14 %
Lymphs Abs: 1.8 10*3/uL (ref 0.7–4.0)
MCH: 29.4 pg (ref 26.0–34.0)
MCHC: 33.9 g/dL (ref 30.0–36.0)
MCV: 86.9 fL (ref 80.0–100.0)
Monocytes Absolute: 0.5 10*3/uL (ref 0.1–1.0)
Monocytes Relative: 4 %
Neutro Abs: 10.6 10*3/uL — ABNORMAL HIGH (ref 1.7–7.7)
Neutrophils Relative %: 80 %
Platelets: 281 10*3/uL (ref 150–400)
RBC: 4.35 MIL/uL (ref 3.87–5.11)
RDW: 12.1 % (ref 11.5–15.5)
WBC: 13.1 10*3/uL — ABNORMAL HIGH (ref 4.0–10.5)
nRBC: 0 % (ref 0.0–0.2)

## 2022-03-04 LAB — CBG MONITORING, ED: Glucose-Capillary: 83 mg/dL (ref 70–99)

## 2022-03-04 MED ORDER — SODIUM CHLORIDE 0.9 % IV SOLN
INTRAVENOUS | Status: DC
Start: 2022-03-04 — End: 2022-03-05

## 2022-03-04 NOTE — ED Notes (Signed)
Pt ambulatory to restroom

## 2022-03-04 NOTE — ED Notes (Signed)
Pt informed me that 3-4 weeks ago, she was at her doctor's office having a bandage removed. She fainted there as well.

## 2022-03-04 NOTE — ED Triage Notes (Signed)
Pt brought in by [facility] for c/o syncopal episode; pt states she was working in the garden when she stated she felt faint and laid down on the ground  Ems arrived and pressure of 96/66  Ems administered 1075m of fluid and pressure of 135/60 at arrival  Cbg 108

## 2022-03-04 NOTE — ED Notes (Signed)
Orthostatic vitals:  Laying HR 78 109/57 Sitting HR 79 126/68 Standing HR 81 119/60

## 2022-03-04 NOTE — ED Provider Notes (Signed)
[facility] EMERGENCY DEPARTMENT Provider Note   CSN: [id number] Arrival date & time: 03/04/22  1518     History {Add pertinent medical, surgical, social history, OB history to HPI:1} Chief Complaint  Patient presents with   Loss of Consciousness    Cheryl Gutierrez is a 66 y.o. female.   Loss of Consciousness   This patient is a 66 year old female, she has a history of some reactive airway disease on albuterol, high cholesterol on Lipitor, acid reflux on Protonix, presenting to the hospital today with a complaint of multiple episodes of syncope.  The patient reports that she was in her usual state of health this morning, she did not have anything to drink all day except for several ounces of fluid, she was working out in the heat gardening, picking weeds and walking around feeling very dehydrated.  She started to get more lightheaded and had to sit down.  She tried to walk back to her garden shed when she passed out on the ground.  She came around on a brick sidewalk, her husband helped her up and helped her get back to the house but by the time she got back she was lightheaded and near syncopal again and collapsed into a chair, this was again short-lived.  The paramedics found the patient to be slightly hypotensive and gave IV fluids which improved her blood pressure from the blood pressure of 96/66 up to 135/60.  She states that she has generalized fatigue at this point but does not have any chest pain palpitation shortness of breath fevers chills nausea vomiting or diarrhea other than feeling nauseated when she was about to pass out.  There is no vertigo, no ear pain, no sore throat, she does not have a headache or any visual changes.  Denies any rashes, tick bites, swelling or other symptoms.  She reports to me that she "passes out very easy" and has "passed out many times in the past".  Home Medications Prior to Admission medications   Medication Sig Start Date End Date Taking?  Authorizing Provider  albuterol (VENTOLIN HFA) 108 (90 Base) MCG/ACT inhaler Inhale 1-2 puffs into the lungs every 4 (four) hours as needed for wheezing or shortness of breath. 07/21/21   Melynda Ripple, MD  atorvastatin (LIPITOR) 20 MG tablet Take 20 mg by mouth daily. 05/17/17   [provider]  benzonatate (TESSALON) 100 MG capsule Take 1 capsule (100 mg total) by mouth 3 (three) times daily as needed for cough. 09/26/21   Jaynee Eagles, PA-C  cholecalciferol (VITAMIN D) 1000 UNITS tablet Take 1,000 Units by mouth daily. Reported on 11/16/2015    [provider]  HYDROcodone-acetaminophen (NORCO/VICODIN) 5-325 MG tablet Take 1-2 tablets by mouth every 6 (six) hours as needed for moderate pain. 01/16/22   Marybelle Killings, MD  pantoprazole (PROTONIX) 40 MG tablet Take 40 mg by mouth every morning. 06/28/21   [provider]  PARoxetine (PAXIL) 40 MG tablet Take 40 mg by mouth every morning.      [provider]  predniSONE (DELTASONE) 10 MG tablet Take 2 tablets (20 mg total) by mouth daily. Patient not taking: Reported on 12/29/2021 11/15/21   Volney American, PA-C  promethazine-dextromethorphan (PROMETHAZINE-DM) 6.25-15 MG/5ML syrup Take 5 mLs by mouth 4 (four) times daily as needed. 11/15/21   Volney American, PA-C  Spacer/Aero-Holding Chambers (AEROCHAMBER PLUS) inhaler Use with inhaler 07/21/21   Melynda Ripple, MD      Allergies    Patient  has no known allergies.    Review of Systems   Review of Systems  Cardiovascular:  Positive for syncope.  All other systems reviewed and are negative.   Physical Exam Updated Vital Signs BP 113/69   Pulse 76   Temp 97.6 F (36.4 C) (Oral)   Resp 10   Ht 1.626 m ('5\' 4"'$ )   Wt 68.9 kg   SpO2 100%   BMI 26.09 kg/m  Physical Exam Vitals and nursing note reviewed.  Constitutional:      General: She is not in acute distress.    Appearance: She is well-developed.  HENT:     Head: Normocephalic and  atraumatic.     Mouth/Throat:     Pharynx: No oropharyngeal exudate.  Eyes:     General: No scleral icterus.       Right eye: No discharge.        Left eye: No discharge.     Conjunctiva/sclera: Conjunctivae normal.     Pupils: Pupils are equal, round, and reactive to light.  Neck:     Thyroid: No thyromegaly.     Vascular: No JVD.  Cardiovascular:     Rate and Rhythm: Normal rate and regular rhythm.     Heart sounds: Normal heart sounds. No murmur heard.    No friction rub. No gallop.  Pulmonary:     Effort: Pulmonary effort is normal. No respiratory distress.     Breath sounds: Normal breath sounds. No wheezing or rales.  Abdominal:     General: Bowel sounds are normal. There is no distension.     Palpations: Abdomen is soft. There is no mass.     Tenderness: There is no abdominal tenderness.  Musculoskeletal:        General: No tenderness. Normal range of motion.     Cervical back: Normal range of motion and neck supple.  Lymphadenopathy:     Cervical: No cervical adenopathy.  Skin:    General: Skin is warm and dry.     Findings: No erythema or rash.  Neurological:     Mental Status: She is alert.     Coordination: Coordination normal.     Comments: Speech is clear, cranial nerves III through XII are intact, memory is intact, strength is normal in all 4 extremities including grips, sensation is intact to light touch and pinprick in all 4 extremities. Coordination as tested by finger-nose-finger is normal, no limb ataxia. Normal gait, normal reflexes at the patellar tendons bilaterally  Psychiatric:        Behavior: Behavior normal.     ED Results / Procedures / Treatments   Labs (all labs ordered are listed, but only abnormal results are displayed) Labs Reviewed  BASIC METABOLIC PANEL  CBC WITH DIFFERENTIAL/PLATELET  CBG MONITORING, ED    EKG None  Radiology No results found.  Procedures Procedures  {Document cardiac monitor, telemetry assessment procedure  when appropriate:1}  Medications Ordered in ED Medications  0.9 %  sodium chloride infusion (has no administration in time range)    ED Course/ Medical Decision Making/ A&P                           Medical Decision Making Amount and/or Complexity of Data Reviewed Labs: ordered.  Risk Prescription drug management.   This patient presents to the ED for concern of syncope, this involves an extensive number of treatment options, and is a complaint that carries with it a  high risk of complications and morbidity.  The differential diagnosis includes electrolyte disturbance, dehydration, orthostasis, primary cardiac arrhythmia, medication interactions, less likely to be acute coronary syndrome, stroke or other serious concerns given the patient's history and history of present illness   Co morbidities that complicate the patient evaluation  History of high cholesterol, history of frequent syncope   Additional history obtained:  Additional history obtained from paramedics as well as the electronic medical record External records from outside source obtained and reviewed including I had actually seen the patient for a syncopal episode in May 2022 about 14 months ago.  The patient has had multiple office visits for her musculoskeletal symptoms,   Lab Tests:  I Ordered, and personally interpreted labs.  The pertinent results include:  ***   Imaging Studies ordered:  I ordered imaging studies including ***  I independently visualized and interpreted imaging which showed *** I agree with the radiologist interpretation   Cardiac Monitoring: / EKG:  The patient was maintained on a cardiac monitor.  I personally viewed and interpreted the cardiac monitored which showed an underlying rhythm of: ***   Consultations Obtained:  I requested consultation with the ***,  and discussed lab and imaging findings as well as pertinent plan - they recommend: ***   Problem List / ED Course /  Critical interventions / Medication management  *** I ordered medication including ***  for ***  Reevaluation of the patient after these medicines showed that the patient {resolved/improved/worsened:23923::"improved"} I have reviewed the patients home medicines and have made adjustments as needed   Social Determinants of Health:  ***   Test / Admission - Considered:  ***   {Document critical care time when appropriate:1} {Document review of labs and clinical decision tools ie heart score, Chads2Vasc2 etc:1}  {Document your independent review of radiology images, and any outside records:1} {Document your discussion with family members, caretakers, and with consultants:1} {Document social determinants of health affecting pt's care:1} {Document your decision making why or why not admission, treatments were needed:1} Final Clinical Impression(s) / ED Diagnoses Final diagnoses:  None    Rx / DC Orders ED Discharge Orders     None

## 2022-03-04 NOTE — Discharge Instructions (Signed)
Make sure you are drinking plenty of clear liquids to stay hydrated, see your doctor this week for recheck, if you continue to be lightheaded when you stand up then you need to drink more fluids.  ER for severe or worsening symptoms

## 2022-04-02 NOTE — Progress Notes (Signed)
   Follow-Up Visit   Subjective  Cheryl Gutierrez is a 66 y.o. female who presents for the following: Annual Exam (No new concerns).  Visit canceled Location:  Duration:  Quality:  Associated Signs/Symptoms: Modifying Factors:  Severity:  Timing: Context:   Objective  Well appearing patient in no apparent distress; mood and affect are within normal limits. Visit canceled    A focused examination was performed including no areas examined, visit canceled. Relevant physical exam findings are noted in the Assessment and Plan.   Assessment & Plan    Encounter for screening for malignant neoplasm of skin  Patient may reschedule      I, Lavonna Monarch, MD, have reviewed all documentation for this visit.  The documentation on 04/02/22 for the exam, diagnosis, procedures, and orders are all accurate and complete.

## 2022-05-13 IMAGING — DX DG HIP (WITH OR WITHOUT PELVIS) 2-3V*L*
3 series · 3 of 3 positions shown · non-contrast
Comparison: None.

CLINICAL DATA: Left hip pain after fall

EXAM:
DG HIP (WITH OR WITHOUT PELVIS) 2-3V LEFT

[pelvis ap]
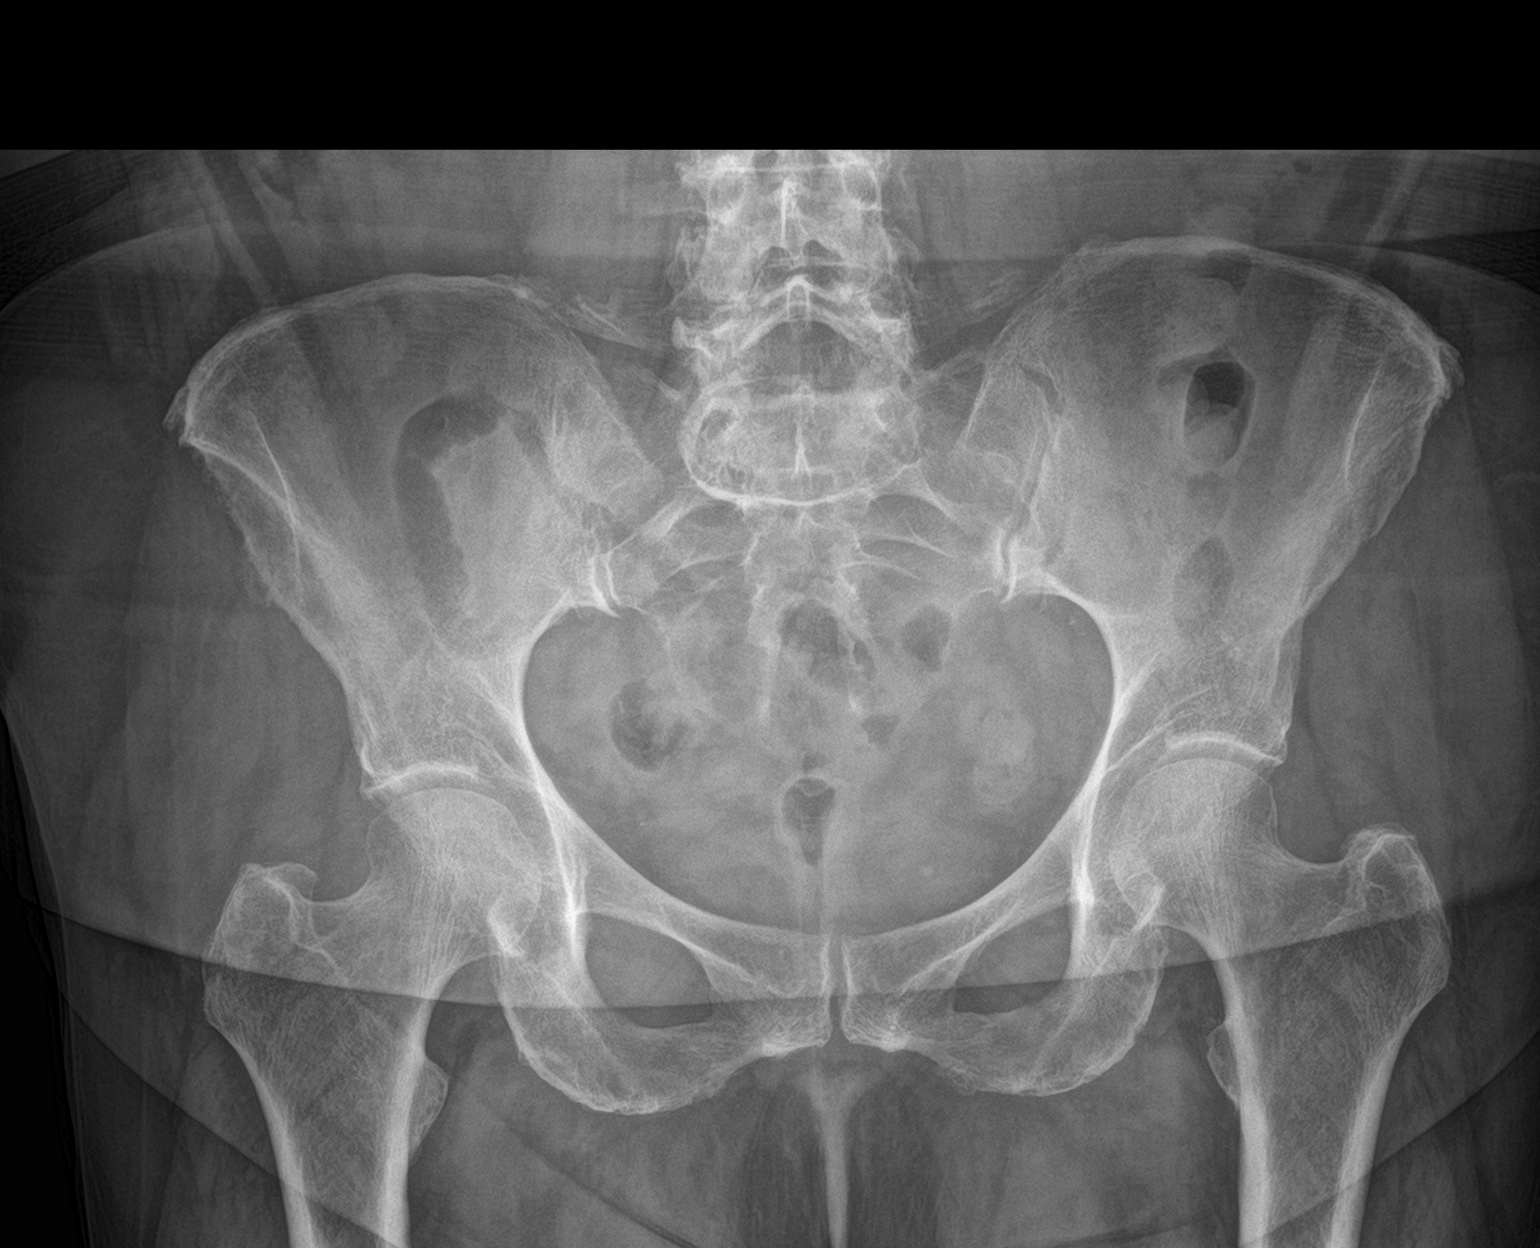

[hip ap]
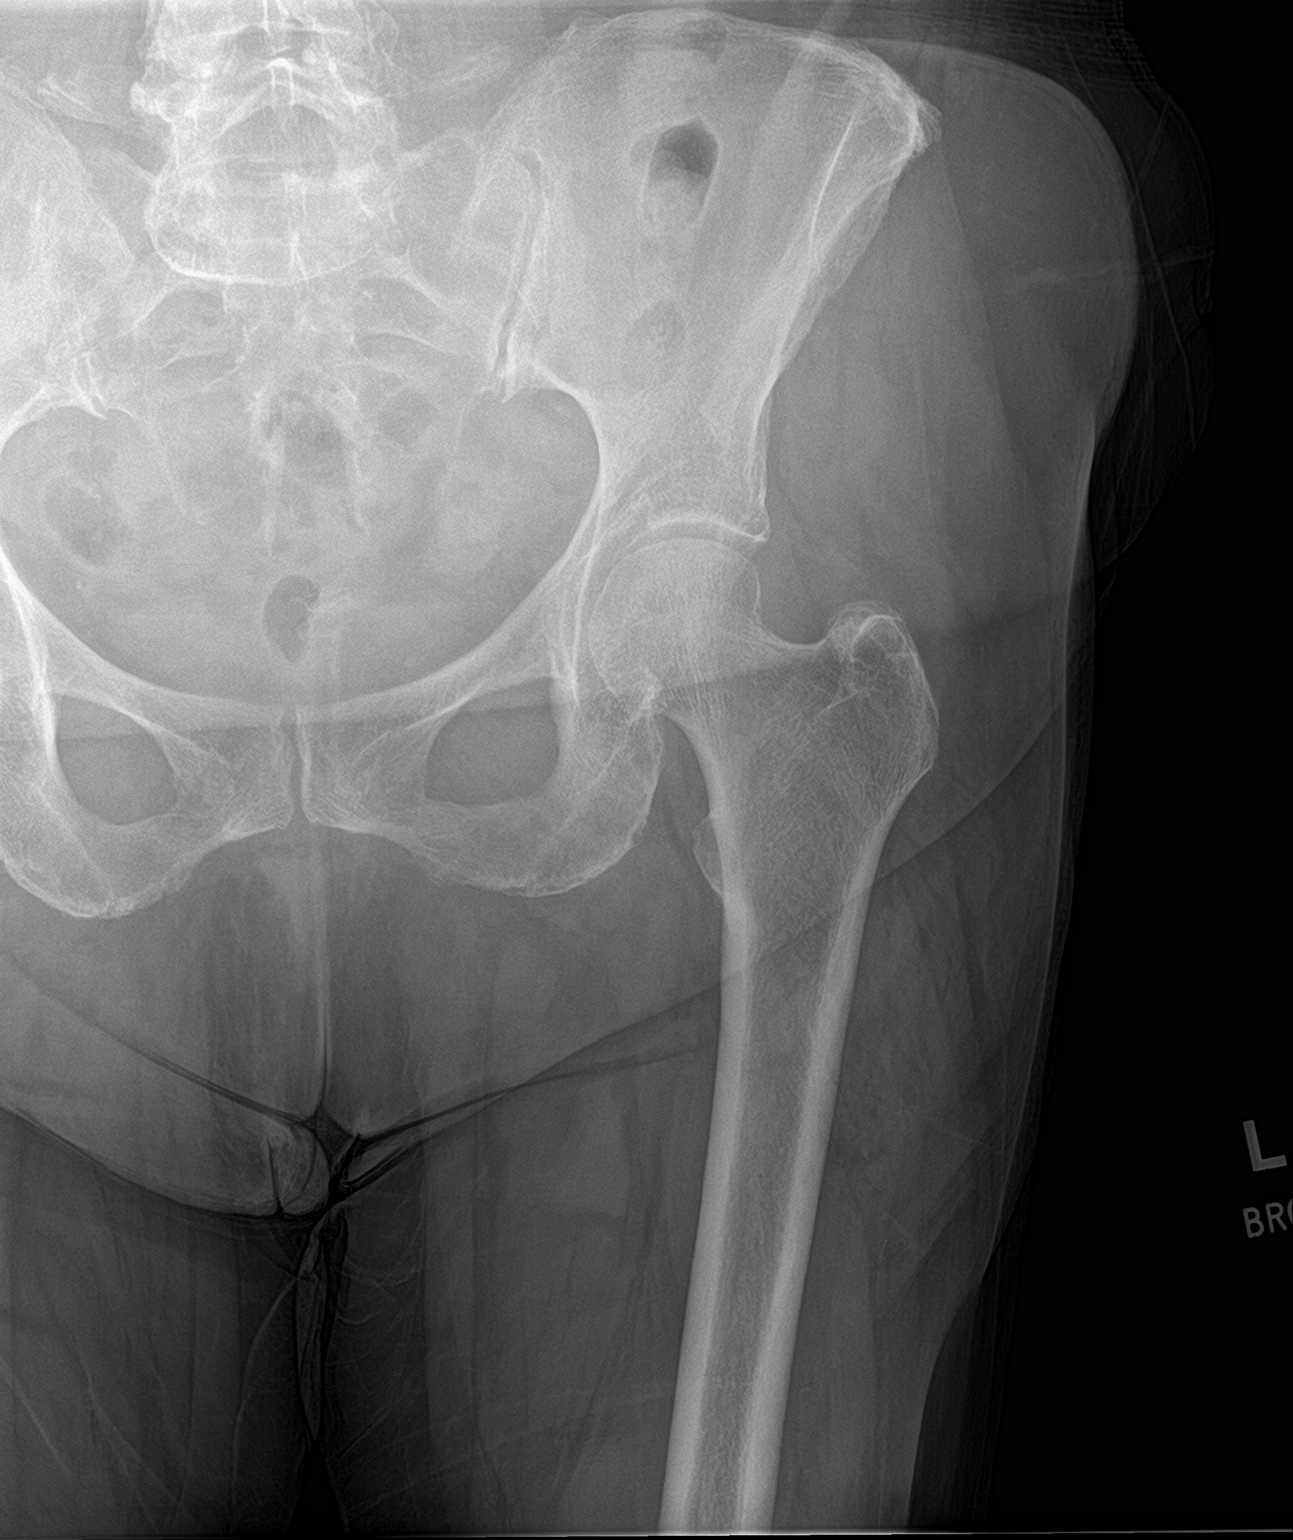

[hip lat]
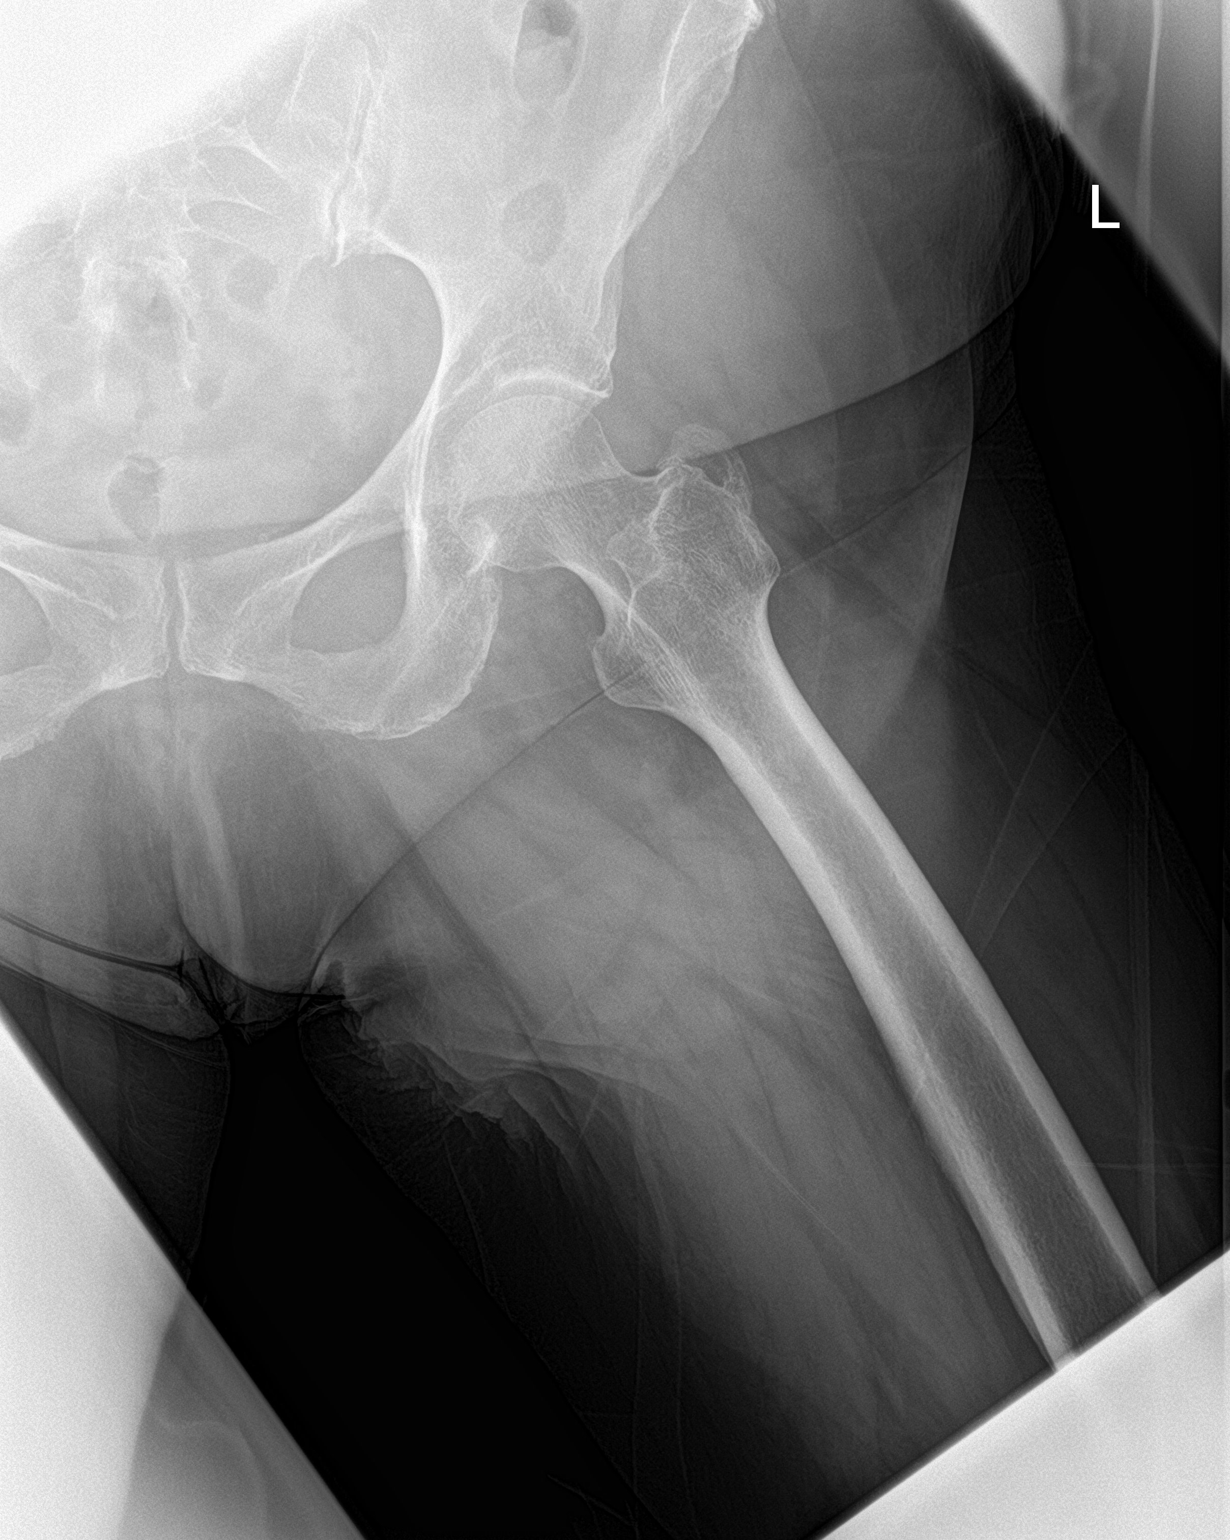

[3 of 3 positions shown; findings below may reference images not displayed]

FINDINGS: There is no evidence of hip fracture or dislocation. Left hip joint
space is maintained. Mild arthropathy of the bilateral SI joints. No
soft tissue abnormality evident.
IMPRESSION: Negative.

## 2022-08-17 ENCOUNTER — Ambulatory Visit: Payer: Medicare Other | Admitting: Orthopaedic Surgery

## 2022-08-23 ENCOUNTER — Ambulatory Visit: Payer: Medicare Other | Admitting: Orthopaedic Surgery

## 2022-08-31 ENCOUNTER — Encounter: Payer: Self-pay | Admitting: Orthopaedic Surgery

## 2022-08-31 ENCOUNTER — Ambulatory Visit (INDEPENDENT_AMBULATORY_CARE_PROVIDER_SITE_OTHER): Payer: Medicare Other

## 2022-08-31 ENCOUNTER — Ambulatory Visit (INDEPENDENT_AMBULATORY_CARE_PROVIDER_SITE_OTHER): Payer: Medicare Other | Admitting: Orthopaedic Surgery

## 2022-08-31 VITALS — Ht 64.0 in | Wt 152.0 lb

## 2022-08-31 DIAGNOSIS — G8929 Other chronic pain: Secondary | ICD-10-CM

## 2022-08-31 DIAGNOSIS — M25511 Pain in right shoulder: Secondary | ICD-10-CM

## 2022-08-31 NOTE — Progress Notes (Signed)
Office Visit Note   Patient: Cheryl Gutierrez           Date of Birth: [birth year]           MRN: [id number] Visit Date: 08/31/2022              Requested by: Glenda Chroman, MD [place],  [place] [id number] PCP: Glenda Chroman, MD   Assessment & Plan: Visit Diagnoses:  1. Chronic right shoulder pain     Plan: We discussed she can consider returning for a possible injection or MRI scan she has ongoing symptoms.  She has negative drop arm test good passive range of motion good sensation her hand.  She will monitor symptoms and return if she has increased problems.  Follow-Up Instructions: No follow-ups on file.   Orders:  Orders Placed This Encounter  Procedures   XR Shoulder Right   No orders of the defined types were placed in this encounter.     Procedures: No procedures performed   Clinical Data: No additional findings.   Subjective: Chief Complaint  Patient presents with   Right Shoulder - Pain    HPI 67 year old female with problems with right shoulder stenosis and popping and pain anteriorly with particular position she cannot exactly reproduce it.  It bothers her for a number of seconds and then resolves.  She has had previous biceps tenodesis by me.  Review of Systems updated unchanged   Objective: Vital Signs: Ht '5\' 4"'$  (1.626 m)   Wt 152 lb (68.9 kg)   BMI 26.09 kg/m   Physical Exam Constitutional:      Appearance: She is well-developed.  HENT:     Head: Normocephalic.     Right Ear: External ear normal.     Left Ear: External ear normal. There is no impacted cerumen.  Eyes:     Pupils: Pupils are equal, round, and reactive to light.  Neck:     Thyroid: No thyromegaly.     Trachea: No tracheal deviation.  Cardiovascular:     Rate and Rhythm: Normal rate.  Pulmonary:     Effort: Pulmonary effort is normal.  Abdominal:     Palpations: Abdomen is soft.  Musculoskeletal:     Cervical back: No rigidity.  Skin:    General: Skin is warm  and dry.  Neurological:     Mental Status: She is alert and oriented to person, place, and time.  Psychiatric:        Behavior: Behavior normal.     Ortho Exam no distal migration of the biceps muscle.  Specialty Comments:  No specialty comments available.  Imaging: No results found.   PMFS History: Patient Active Problem List   Diagnosis Date Noted   Trochanteric bursitis, left hip 12/29/2021   Pain in right knee 10/06/2021   Tendinitis, de Quervain's 09/08/2021   Biceps tendinosis of left shoulder 09/26/2017   Arthralgia of acromioclavicular joint 05/25/2014   GERD (gastroesophageal reflux disease) 05/08/2013   Elevated liver enzymes 05/08/2013   Abdominal pain 03/25/2013   Rotator cuff syndrome of left shoulder 07/31/2011   Past Medical History:  Diagnosis Date   Diabetes mellitus without complication ([place])    Controlled with diet   Hiatal hernia    Hyperplastic colon polyp    Reflux esophagitis    erosive    Family History  Problem Relation Age of Onset   Depression Mother    Anesthesia problems Neg Hx    Broken  bones Neg Hx    Cancer Neg Hx    Clotting disorder Neg Hx    Collagen disease Neg Hx    Diabetes Neg Hx    Dislocations Neg Hx    Osteoporosis Neg Hx    Rheumatologic disease Neg Hx    Scoliosis Neg Hx    Severe sprains Neg Hx     Past Surgical History:  Procedure Laterality Date   ABDOMINAL HYSTERECTOMY     COLONOSCOPY  07/29/2007   Dr. Gala Romney- anal papillae, o/w normal rectum, pedunculated polyp,mic sigmoid colon, hyperplastic polyp on bx, the remainder of the colonic mucosa appeared normal   ESOPHAGOGASTRODUODENOSCOPY N/A 03/31/2013   Dr. Raliegh Scarlet reflux esophagitis, hiatal hernia, gastric erosions, gastric polyp.bx= mild chronic inactive gastritis, fundic gland polyp   Social History   Occupational History   Occupation: clerical  Tobacco Use   Smoking status: Never   Smokeless tobacco: Never  Vaping Use   Vaping Use: Never used   Substance and Sexual Activity   Alcohol use: No   Drug use: No   Sexual activity: Not on file

## 2023-07-04 ENCOUNTER — Other Ambulatory Visit (HOSPITAL_COMMUNITY): Payer: Self-pay | Admitting: Internal Medicine

## 2023-07-04 DIAGNOSIS — R109 Unspecified abdominal pain: Secondary | ICD-10-CM

## 2023-07-09 ENCOUNTER — Encounter: Payer: Self-pay | Admitting: *Deleted

## 2023-07-09 ENCOUNTER — Encounter: Payer: Self-pay | Admitting: Gastroenterology

## 2023-07-09 ENCOUNTER — Ambulatory Visit (INDEPENDENT_AMBULATORY_CARE_PROVIDER_SITE_OTHER): Payer: Medicare Other | Admitting: Gastroenterology

## 2023-07-09 VITALS — BP 140/68 | HR 81 | Temp 98.0°F | Ht 63.0 in | Wt 158.2 lb

## 2023-07-09 DIAGNOSIS — R194 Change in bowel habit: Secondary | ICD-10-CM

## 2023-07-09 DIAGNOSIS — R1013 Epigastric pain: Secondary | ICD-10-CM | POA: Diagnosis not present

## 2023-07-09 DIAGNOSIS — R6881 Early satiety: Secondary | ICD-10-CM | POA: Diagnosis not present

## 2023-07-09 DIAGNOSIS — R198 Other specified symptoms and signs involving the digestive system and abdomen: Secondary | ICD-10-CM | POA: Insufficient documentation

## 2023-07-09 NOTE — H&P (View-Only) (Signed)
 GI Office Note    Referring Provider: Ignatius Specking, MD Primary Care Physician:  Ignatius Specking, MD  Primary Gastroenterologist: Roetta Sessions, MD   Chief Complaint   Chief Complaint  Patient presents with   Abdominal Pain    Upper abdominal pain that has been going on for most of the year     History of Present Illness   Cheryl Gutierrez is a 67 y.o. female presenting today at the request of Dr. Sherril Croon for abdominal pain. Patient last seen here in 2014.   She is currently scheduled for a CT A/P with contrast on 12/13. Recently started on pantoprazole.   She complains of one year history of worsening early satiety, postprandial epigastric pain. Feels like a rock sitting in her upper abdomen after meals. Sometimes with radiation into her back. No N/V. No heartburn. Finds she can't eat but about 1/3 of her normal portions. She denies weight loss. No dysphagia. She has known hiatal hernia. BMs have also changed. Went from daily [place] 4 to having [place] 5-6 every 2-3 days.  Gallbladder remains in situ.   Labs 06/2023: WBC 8.2, Hgb 14.5, Platelets 351, creatinine 0.86, BUN 15, sodium 140, Potassium 3.9, albumin 4.4, Tbili 0.4, AP 85, AST 22, ALT 19,   Cologuard negative 01/2022. She reports that she would never do another colonoscopy again due to bowel prep.    EGD 03/2013: -erosive reflux esophagitis -hiatal hernia -gastric erosions, gastritis no h.pylori -gastric polyp, fundic gland  Colonoscopy 2008: -hyperplastic polyp removed  Medications   Current Outpatient Medications  Medication Sig Dispense Refill   alendronate (FOSAMAX) 70 MG tablet PLEASE SEE ATTACHED FOR DETAILED DIRECTIONS     atorvastatin (LIPITOR) 20 MG tablet Take 20 mg by mouth daily.  4   cholecalciferol (VITAMIN D) 1000 UNITS tablet Take 1,000 Units by mouth daily. Reported on 11/16/2015     pantoprazole (PROTONIX) 40 MG tablet Take 40 mg by mouth every morning.     PARoxetine (PAXIL) 40 MG tablet  Take 40 mg by mouth every morning.       No current facility-administered medications for this visit.    Allergies   Allergies as of 07/09/2023   (No Known Allergies)    Past Medical History   Past Medical History:  Diagnosis Date   Diabetes mellitus without complication (HCC)    Controlled with diet   Hiatal hernia    Hyperplastic colon polyp    Reflux esophagitis    erosive    Past Surgical History   Past Surgical History:  Procedure Laterality Date   ABDOMINAL HYSTERECTOMY     COLONOSCOPY  07/29/2007   Dr. Jena Gauss- anal papillae, o/w normal rectum, pedunculated polyp,mic sigmoid colon, hyperplastic polyp on bx, the remainder of the colonic mucosa appeared normal   ESOPHAGOGASTRODUODENOSCOPY N/A 03/31/2013   Dr. Darnelle Going reflux esophagitis, hiatal hernia, gastric erosions, gastric polyp.bx= mild chronic inactive gastritis, fundic gland polyp    Past Family History   Family History  Problem Relation Age of Onset   Depression Mother    Anesthesia problems Neg Hx    Broken bones Neg Hx    Cancer Neg Hx    Clotting disorder Neg Hx    Collagen disease Neg Hx    Diabetes Neg Hx    Dislocations Neg Hx    Osteoporosis Neg Hx    Rheumatologic disease Neg Hx    Scoliosis Neg Hx    Severe sprains Neg Hx  Past Social History   Social History   Socioeconomic History   Marital status: Married    Spouse name: Not on file   Number of children: Not on file   Years of education: college   Highest education level: Not on file  Occupational History   Occupation: clerical  Tobacco Use   Smoking status: Never   Smokeless tobacco: Never  Vaping Use   Vaping status: Never Used  Substance and Sexual Activity   Alcohol use: No   Drug use: No   Sexual activity: Not on file  Other Topics Concern   Not on file  Social History Narrative   Not on file   Social Determinants of Health   Financial Resource Strain: Not on file  Food Insecurity: Not on file   Transportation Needs: Not on file  Physical Activity: Not on file  Stress: Not on file  Social Connections: Not on file  Intimate Partner Violence: Not on file    Review of Systems   General: Negative for anorexia, weight loss, fever, chills, fatigue, weakness. Eyes: Negative for vision changes.  ENT: Negative for hoarseness, difficulty swallowing , nasal congestion. CV: Negative for chest pain, angina, palpitations, dyspnea on exertion, peripheral edema.  Respiratory: Negative for dyspnea at rest, dyspnea on exertion, cough, sputum, wheezing.  GI: See history of present illness. GU:  Negative for dysuria, hematuria, urinary incontinence, urinary frequency, nocturnal urination.  MS: Negative for joint pain, low back pain.  Derm: Negative for rash or itching.  Neuro: Negative for weakness, abnormal sensation, seizure, frequent headaches, memory loss,  confusion.  Psych: Negative for anxiety, depression, suicidal ideation, hallucinations.  Endo: Negative for unusual weight change.  Heme: Negative for bruising or bleeding. Allergy: Negative for rash or hives.  Physical Exam   BP (!) 140/68 (BP Location: Right Arm, Patient Position: Sitting, Cuff Size: Normal)   Pulse 81   Temp 98 F (36.7 C) (Oral)   Ht 5\' 3"  (1.6 m)   Wt 158 lb 3.2 oz (71.8 kg)   SpO2 98%   BMI 28.02 kg/m    General: Well-nourished, well-developed in no acute distress.  Head: Normocephalic, atraumatic.   Eyes: Conjunctiva pink, no icterus. Mouth: Oropharyngeal mucosa moist and pink   Neck: Supple without thyromegaly, masses, or lymphadenopathy.  Lungs: Clear to auscultation bilaterally.  Heart: Regular rate and rhythm, no murmurs rubs or gallops.  Abdomen: Bowel sounds are normal,  nondistended, no hepatosplenomegaly or masses,  no abdominal bruits or hernia, no rebound or guarding.  Mild epigastric tenderness. No CVA tenderness. Rectal: not performed Extremities: No lower extremity edema. No clubbing  or deformities.  Neuro: Alert and oriented x 4 , grossly normal neurologically.  Skin: Warm and dry, no rash or jaundice.   Psych: Alert and cooperative, normal mood and affect.  Labs   See hpi  Imaging Studies   No results found.  Assessment/Plan:    Epigastric pain/early satiety: no significant typical heartburn, symptoms could be due to gastritis, PUD, GERD, malignancy, biliary etiology -continue pantoprazole 40mg  daily -follow up CT scan as available. She plans to call to try to move CT scan closer, if any cancellations. She does not want to have done elsewhere to speed up process.  -plan for EGD with Dr. Jena Gauss. ASA 2.  I have discussed the risks, alternatives, benefits with regards to but not limited to the risk of reaction to medication, bleeding, infection, perforation and the patient is agreeable to proceed. Written consent to be obtained.  Change in bowels: stools less frequent but now loose. No change in diet. Etiology may be connected to ugi symptoms.  -await CT findings -TTG IgA, IgA, sed rate, CRP   Leanna Battles. Melvyn Neth, MHS, PA-C [facility]

## 2023-07-09 NOTE — Patient Instructions (Signed)
Complete labs and CT scan as planned.   Upper endoscopy in the near future, to be done after your pending CT scan.   Call if your symptoms worsen.

## 2023-07-09 NOTE — Progress Notes (Signed)
GI Office Note    Referring Provider: Ignatius Specking, MD Primary Care Physician:  Ignatius Specking, MD  Primary Gastroenterologist: Roetta Sessions, MD   Chief Complaint   Chief Complaint  Patient presents with   Abdominal Pain    Upper abdominal pain that has been going on for most of the year     History of Present Illness   Cheryl Gutierrez is a 67 y.o. female presenting today at the request of Dr. Sherril Croon for abdominal pain. Patient last seen here in 2014.   She is currently scheduled for a CT A/P with contrast on 12/13. Recently started on pantoprazole.   She complains of one year history of worsening early satiety, postprandial epigastric pain. Feels like a rock sitting in her upper abdomen after meals. Sometimes with radiation into her back. No N/V. No heartburn. Finds she can't eat but about 1/3 of her normal portions. She denies weight loss. No dysphagia. She has known hiatal hernia. BMs have also changed. Went from daily [place] 4 to having [place] 5-6 every 2-3 days.  Gallbladder remains in situ.   Labs 06/2023: WBC 8.2, Hgb 14.5, Platelets 351, creatinine 0.86, BUN 15, sodium 140, Potassium 3.9, albumin 4.4, Tbili 0.4, AP 85, AST 22, ALT 19,   Cologuard negative 01/2022. She reports that she would never do another colonoscopy again due to bowel prep.    EGD 03/2013: -erosive reflux esophagitis -hiatal hernia -gastric erosions, gastritis no h.pylori -gastric polyp, fundic gland  Colonoscopy 2008: -hyperplastic polyp removed  Medications   Current Outpatient Medications  Medication Sig Dispense Refill   alendronate (FOSAMAX) 70 MG tablet PLEASE SEE ATTACHED FOR DETAILED DIRECTIONS     atorvastatin (LIPITOR) 20 MG tablet Take 20 mg by mouth daily.  4   cholecalciferol (VITAMIN D) 1000 UNITS tablet Take 1,000 Units by mouth daily. Reported on 11/16/2015     pantoprazole (PROTONIX) 40 MG tablet Take 40 mg by mouth every morning.     PARoxetine (PAXIL) 40 MG tablet  Take 40 mg by mouth every morning.       No current facility-administered medications for this visit.    Allergies   Allergies as of 07/09/2023   (No Known Allergies)    Past Medical History   Past Medical History:  Diagnosis Date   Diabetes mellitus without complication (HCC)    Controlled with diet   Hiatal hernia    Hyperplastic colon polyp    Reflux esophagitis    erosive    Past Surgical History   Past Surgical History:  Procedure Laterality Date   ABDOMINAL HYSTERECTOMY     COLONOSCOPY  07/29/2007   Dr. Jena Gauss- anal papillae, o/w normal rectum, pedunculated polyp,mic sigmoid colon, hyperplastic polyp on bx, the remainder of the colonic mucosa appeared normal   ESOPHAGOGASTRODUODENOSCOPY N/A 03/31/2013   Dr. Darnelle Going reflux esophagitis, hiatal hernia, gastric erosions, gastric polyp.bx= mild chronic inactive gastritis, fundic gland polyp    Past Family History   Family History  Problem Relation Age of Onset   Depression Mother    Anesthesia problems Neg Hx    Broken bones Neg Hx    Cancer Neg Hx    Clotting disorder Neg Hx    Collagen disease Neg Hx    Diabetes Neg Hx    Dislocations Neg Hx    Osteoporosis Neg Hx    Rheumatologic disease Neg Hx    Scoliosis Neg Hx    Severe sprains Neg Hx  Past Social History   Social History   Socioeconomic History   Marital status: Married    Spouse name: Not on file   Number of children: Not on file   Years of education: college   Highest education level: Not on file  Occupational History   Occupation: clerical  Tobacco Use   Smoking status: Never   Smokeless tobacco: Never  Vaping Use   Vaping status: Never Used  Substance and Sexual Activity   Alcohol use: No   Drug use: No   Sexual activity: Not on file  Other Topics Concern   Not on file  Social History Narrative   Not on file   Social Determinants of Health   Financial Resource Strain: Not on file  Food Insecurity: Not on file   Transportation Needs: Not on file  Physical Activity: Not on file  Stress: Not on file  Social Connections: Not on file  Intimate Partner Violence: Not on file    Review of Systems   General: Negative for anorexia, weight loss, fever, chills, fatigue, weakness. Eyes: Negative for vision changes.  ENT: Negative for hoarseness, difficulty swallowing , nasal congestion. CV: Negative for chest pain, angina, palpitations, dyspnea on exertion, peripheral edema.  Respiratory: Negative for dyspnea at rest, dyspnea on exertion, cough, sputum, wheezing.  GI: See history of present illness. GU:  Negative for dysuria, hematuria, urinary incontinence, urinary frequency, nocturnal urination.  MS: Negative for joint pain, low back pain.  Derm: Negative for rash or itching.  Neuro: Negative for weakness, abnormal sensation, seizure, frequent headaches, memory loss,  confusion.  Psych: Negative for anxiety, depression, suicidal ideation, hallucinations.  Endo: Negative for unusual weight change.  Heme: Negative for bruising or bleeding. Allergy: Negative for rash or hives.  Physical Exam   BP (!) 140/68 (BP Location: Right Arm, Patient Position: Sitting, Cuff Size: Normal)   Pulse 81   Temp 98 F (36.7 C) (Oral)   Ht 5\' 3"  (1.6 m)   Wt 158 lb 3.2 oz (71.8 kg)   SpO2 98%   BMI 28.02 kg/m    General: Well-nourished, well-developed in no acute distress.  Head: Normocephalic, atraumatic.   Eyes: Conjunctiva pink, no icterus. Mouth: Oropharyngeal mucosa moist and pink   Neck: Supple without thyromegaly, masses, or lymphadenopathy.  Lungs: Clear to auscultation bilaterally.  Heart: Regular rate and rhythm, no murmurs rubs or gallops.  Abdomen: Bowel sounds are normal,  nondistended, no hepatosplenomegaly or masses,  no abdominal bruits or hernia, no rebound or guarding.  Mild epigastric tenderness. No CVA tenderness. Rectal: not performed Extremities: No lower extremity edema. No clubbing  or deformities.  Neuro: Alert and oriented x 4 , grossly normal neurologically.  Skin: Warm and dry, no rash or jaundice.   Psych: Alert and cooperative, normal mood and affect.  Labs   See hpi  Imaging Studies   No results found.  Assessment/Plan:    Epigastric pain/early satiety: no significant typical heartburn, symptoms could be due to gastritis, PUD, GERD, malignancy, biliary etiology -continue pantoprazole 40mg  daily -follow up CT scan as available. She plans to call to try to move CT scan closer, if any cancellations. She does not want to have done elsewhere to speed up process.  -plan for EGD with Dr. Jena Gauss. ASA 2.  I have discussed the risks, alternatives, benefits with regards to but not limited to the risk of reaction to medication, bleeding, infection, perforation and the patient is agreeable to proceed. Written consent to be obtained.  Change in bowels: stools less frequent but now loose. No change in diet. Etiology may be connected to ugi symptoms.  -await CT findings -TTG IgA, IgA, sed rate, CRP   Leanna Battles. Melvyn Neth, MHS, PA-C [facility]

## 2023-07-11 LAB — TSH+FREE T4
Free T4: 1.03 ng/dL (ref 0.82–1.77)
TSH: 1.62 u[IU]/mL (ref 0.450–4.500)

## 2023-07-11 LAB — C-REACTIVE PROTEIN: CRP: 1 mg/L (ref 0–10)

## 2023-07-11 LAB — SEDIMENTATION RATE: Sed Rate: 5 mm/h (ref 0–40)

## 2023-07-11 LAB — TISSUE TRANSGLUTAMINASE, IGA

## 2023-07-11 LAB — IGA: IgA/Immunoglobulin A, Serum: 70 mg/dL — ABNORMAL LOW (ref 87–352)

## 2023-07-26 ENCOUNTER — Other Ambulatory Visit: Payer: Self-pay

## 2023-07-26 DIAGNOSIS — R1013 Epigastric pain: Secondary | ICD-10-CM

## 2023-07-26 DIAGNOSIS — R6881 Early satiety: Secondary | ICD-10-CM

## 2023-07-26 DIAGNOSIS — R198 Other specified symptoms and signs involving the digestive system and abdomen: Secondary | ICD-10-CM

## 2023-07-27 ENCOUNTER — Ambulatory Visit (HOSPITAL_COMMUNITY)
Admission: RE | Admit: 2023-07-27 | Discharge: 2023-07-27 | Disposition: A | Discharge status: Home / Self Care | Payer: Medicare Other | Source: Ambulatory Visit | Attending: Internal Medicine | Admitting: Internal Medicine

## 2023-07-27 ENCOUNTER — Encounter (HOSPITAL_COMMUNITY): Payer: Self-pay

## 2023-07-27 DIAGNOSIS — R109 Unspecified abdominal pain: Secondary | ICD-10-CM | POA: Insufficient documentation

## 2023-07-27 MED ORDER — IOHEXOL 300 MG/ML  SOLN
100.0000 mL | Freq: Once | INTRAMUSCULAR | Status: AC | PRN
  Administered 2023-07-27: 100 mL via INTRAVENOUS

## 2023-07-29 LAB — IGG: IgG (Immunoglobin G), Serum: 920 mg/dL (ref 586–1602)

## 2023-07-29 LAB — TISSUE TRANSGLUTAMINASE, IGG

## 2023-08-02 ENCOUNTER — Other Ambulatory Visit: Payer: Self-pay

## 2023-08-02 ENCOUNTER — Encounter (HOSPITAL_COMMUNITY): Payer: Self-pay | Admitting: Internal Medicine

## 2023-08-02 ENCOUNTER — Ambulatory Visit (HOSPITAL_COMMUNITY): Payer: Medicare Other | Admitting: Anesthesiology

## 2023-08-02 ENCOUNTER — Ambulatory Visit (HOSPITAL_COMMUNITY)
Admission: RE | Admit: 2023-08-02 | Discharge: 2023-08-02 | Disposition: A | Discharge status: Home / Self Care | Payer: Medicare Other | Attending: Internal Medicine | Admitting: Internal Medicine

## 2023-08-02 ENCOUNTER — Telehealth: Payer: Self-pay

## 2023-08-02 ENCOUNTER — Encounter (HOSPITAL_COMMUNITY)
Admission: RE | Disposition: A | Discharge status: Home / Self Care | Payer: Self-pay | Source: Home / Self Care | Attending: Internal Medicine

## 2023-08-02 DIAGNOSIS — K3189 Other diseases of stomach and duodenum: Secondary | ICD-10-CM

## 2023-08-02 DIAGNOSIS — K2100 Gastro-esophageal reflux disease with esophagitis, without bleeding: Secondary | ICD-10-CM | POA: Diagnosis not present

## 2023-08-02 DIAGNOSIS — E119 Type 2 diabetes mellitus without complications: Secondary | ICD-10-CM | POA: Insufficient documentation

## 2023-08-02 DIAGNOSIS — K449 Diaphragmatic hernia without obstruction or gangrene: Secondary | ICD-10-CM | POA: Insufficient documentation

## 2023-08-02 DIAGNOSIS — I10 Essential (primary) hypertension: Secondary | ICD-10-CM | POA: Insufficient documentation

## 2023-08-02 DIAGNOSIS — K317 Polyp of stomach and duodenum: Secondary | ICD-10-CM | POA: Insufficient documentation

## 2023-08-02 DIAGNOSIS — R1013 Epigastric pain: Secondary | ICD-10-CM | POA: Diagnosis present

## 2023-08-02 DIAGNOSIS — R6881 Early satiety: Secondary | ICD-10-CM

## 2023-08-02 HISTORY — PX: ESOPHAGOGASTRODUODENOSCOPY (EGD) WITH PROPOFOL: SHX5813

## 2023-08-02 HISTORY — PX: BIOPSY: SHX5522

## 2023-08-02 HISTORY — PX: POLYPECTOMY: SHX5525

## 2023-08-02 SURGERY — ESOPHAGOGASTRODUODENOSCOPY (EGD) WITH PROPOFOL
Anesthesia: General

## 2023-08-02 MED ORDER — LIDOCAINE HCL (CARDIAC) PF 100 MG/5ML IV SOSY
PREFILLED_SYRINGE | INTRAVENOUS | Status: DC | PRN
  Administered 2023-08-02: 100 mg via INTRAVENOUS

## 2023-08-02 MED ORDER — PROPOFOL 10 MG/ML IV BOLUS
INTRAVENOUS | Status: DC | PRN
  Administered 2023-08-02 (×2): 75 mg via INTRAVENOUS

## 2023-08-02 MED ORDER — LIDOCAINE HCL (PF) 2 % IJ SOLN
INTRAMUSCULAR | Status: AC
  Filled 2023-08-02: qty 5

## 2023-08-02 MED ORDER — STERILE WATER FOR IRRIGATION IR SOLN
Status: DC | PRN
  Administered 2023-08-02: 60 mL

## 2023-08-02 MED ORDER — PROPOFOL 10 MG/ML IV BOLUS
INTRAVENOUS | Status: AC
Start: 2023-08-02 — End: ?
  Filled 2023-08-02: qty 20

## 2023-08-02 MED ORDER — LACTATED RINGERS IV SOLN: INTRAVENOUS | Status: DC

## 2023-08-02 NOTE — Anesthesia Preprocedure Evaluation (Signed)
Anesthesia Evaluation  Patient identified by MRN, date of birth, ID band Patient awake    Reviewed: Allergy & Precautions, H&P , NPO status , Patient's Chart, lab work & pertinent test results, reviewed documented beta blocker date and time   Airway Mallampati: II  TM Distance: >3 FB Neck ROM: full    Dental no notable dental hx.    Pulmonary neg pulmonary ROS   Pulmonary exam normal breath sounds clear to auscultation       Cardiovascular Exercise Tolerance: Good hypertension, negative cardio ROS  Rhythm:regular Rate:Normal     Neuro/Psych  Neuromuscular disease negative neurological ROS  negative psych ROS   GI/Hepatic negative GI ROS, Neg liver ROS, hiatal hernia,GERD  ,,  Endo/Other  negative endocrine ROSdiabetes    Renal/GU negative Renal ROS  negative genitourinary   Musculoskeletal   Abdominal   Peds  Hematology negative hematology ROS (+)   Anesthesia Other Findings   Reproductive/Obstetrics negative OB ROS                             Anesthesia Physical Anesthesia Plan  ASA: 2  Anesthesia Plan: General   Post-op Pain Management:    Induction:   PONV Risk Score and Plan: Propofol infusion  Airway Management Planned:   Additional Equipment:   Intra-op Plan:   Post-operative Plan:   Informed Consent: I have reviewed the patients History and Physical, chart, labs and discussed the procedure including the risks, benefits and alternatives for the proposed anesthesia with the patient or authorized representative who has indicated his/her understanding and acceptance.     Dental Advisory Given  Plan Discussed with: CRNA  Anesthesia Plan Comments:        Anesthesia Quick Evaluation

## 2023-08-02 NOTE — Telephone Encounter (Signed)
-----   Message from Eula Listen sent at 08/02/2023 12:07 PM EST -----   Would be nice if someone can ask radiology to read the CT scan on this patient which was done 12/13.  Thanks.

## 2023-08-02 NOTE — Transfer of Care (Signed)
Immediate Anesthesia Transfer of Care Note  Patient: Cheryl Gutierrez  Procedure(s) Performed: ESOPHAGOGASTRODUODENOSCOPY (EGD) WITH PROPOFOL BIOPSY POLYPECTOMY  Patient Location: Endoscopy Unit  Anesthesia Type:General  Level of Consciousness: awake, alert , and oriented  Airway & Oxygen Therapy: Patient Spontanous Breathing  Post-op Assessment: Report given to RN and Post -op Vital signs reviewed and stable  Post vital signs: Reviewed and stable  Last Vitals:  Vitals Value Taken Time  BP    Temp    Pulse    Resp    SpO2      Last Pain:  Vitals:   08/02/23 1218  TempSrc:   PainSc: 0-No pain      Patients Stated Pain Goal: 3 (08/02/23 1024)  Complications: No notable events documented.

## 2023-08-02 NOTE — Discharge Instructions (Signed)
EGD Discharge instructions Please read the instructions outlined below and refer to this sheet in the next few weeks. These discharge instructions provide you with general information on caring for yourself after you leave the hospital. Your doctor may also give you specific instructions. While your treatment has been planned according to the most current medical practices available, unavoidable complications occasionally occur. If you have any problems or questions after discharge, please call your doctor. ACTIVITY You may resume your regular activity but move at a slower pace for the next 24 hours.  Take frequent rest periods for the next 24 hours.  Walking will help expel (get rid of) the air and reduce the bloated feeling in your abdomen.  No driving for 24 hours (because of the anesthesia (medicine) used during the test).  You may shower.  Do not sign any important legal documents or operate any machinery for 24 hours (because of the anesthesia used during the test).  NUTRITION Drink plenty of fluids.  You may resume your normal diet.  Begin with a light meal and progress to your normal diet.  Avoid alcoholic beverages for 24 hours or as instructed by your caregiver.  MEDICATIONS You may resume your normal medications unless your caregiver tells you otherwise.  WHAT YOU CAN EXPECT TODAY You may experience abdominal discomfort such as a feeling of fullness or "gas" pains.  FOLLOW-UP Your doctor will discuss the results of your test with you.  SEEK IMMEDIATE MEDICAL ATTENTION IF ANY OF THE FOLLOWING OCCUR: Excessive nausea (feeling sick to your stomach) and/or vomiting.  Severe abdominal pain and distention (swelling).  Trouble swallowing.  Temperature over 101 F (37.8 C).  Rectal bleeding or vomiting of blood.       Minimal inflammation in your stomach.  Biopsies of your stomach and small intestine taken.  1 small gastric polyp removed  Further recommendations to follow pending  review of pathology report  Office visit with Tana Coast in 6 weeks  At patient request, I called Haroldine Laws at [phone] -  reviewed findings and recommendations

## 2023-08-02 NOTE — Op Note (Signed)
[facility] Patient Name: Cheryl Gutierrez Procedure Date: 08/02/2023 12:02 PM MRN: [id number] Date of Birth: 26-Dec-1955 Attending MD: Gennette Pac , MD, [id number] CSN: [id number] Age: 67 Admit Type: Outpatient Procedure:                Upper GI endoscopy Indications:              Epigastric abdominal pain, Early satiety Providers:                Gennette Pac, MD, Angelica Ran, Pandora Leiter, Technician Referring MD:              Medicines:                Propofol per Anesthesia Complications:            No immediate complications. Estimated Blood Loss:     Estimated blood loss was minimal. Procedure:                Pre-Anesthesia Assessment:                           - Prior to the procedure, a History and Physical                            was performed, and patient medications and                            allergies were reviewed. The patient's tolerance of                            previous anesthesia was also reviewed. The risks                            and benefits of the procedure and the sedation                            options and risks were discussed with the patient.                            All questions were answered, and informed consent                            was obtained. Prior Anticoagulants: The patient has                            taken no anticoagulant or antiplatelet agents. ASA                            Grade Assessment: II - A patient with mild systemic                            disease. After reviewing the risks and benefits,  the patient was deemed in satisfactory condition to                            undergo the procedure.                           After obtaining informed consent, the endoscope was                            passed under direct vision. Throughout the                            procedure, the patient's blood pressure, pulse, and                             oxygen saturations were monitored continuously. The                            [id number]-[id number] ([id number]) scope was introduced through the                            mouth, and advanced to the second part of duodenum.                            The upper GI endoscopy was accomplished without                            difficulty. The patient tolerated the procedure                            well. Scope In: 12:24:00 PM Scope Out: 12:29:36 PM Total Procedure Duration: 0 hours 5 minutes 36 seconds  Findings:      The examined esophagus was normal. Gastric cavity empty. Mild patchy       erythema the antrum and body. Scattered 3 to 5 mm fundic gland appearing       polyps. No ulcer or infiltrating process. Pylorus patent.      The duodenal bulb and second portion of the duodenum were normal.       Finally, biopsies of the second portion of the duodenum along with the       gastric mucosa were taken for histologic study. One of the gastric       polyps was removed with cold biopsy forceps Impression:               - Normal esophagus. Gastric polyps, gastric                            erythema status post biopsy as described                           - Normal duodenal bulb and second portion of the                            duodenum. Status post duodenal biopsy Moderate Sedation:      Moderate (conscious) sedation was  personally administered by an       anesthesia professional. The following parameters were monitored: oxygen       saturation, heart rate, blood pressure, respiratory rate, EKG, adequacy       of pulmonary ventilation, and response to care. Recommendation:           - Patient has a contact number available for                            emergencies. The signs and symptoms of potential                            delayed complications were discussed with the                            patient. Return to normal activities tomorrow.                            Written discharge  instructions were provided to the                            patient.                           - Advance diet as tolerated. Follow-up on                            pathology. Office visit with [place] in 6 weeks.                            Follow-up on pending CT when results become                            available. Procedure Code(s):        --- Professional ---                           [phone], Esophagogastroduodenoscopy, flexible,                            transoral; diagnostic, including collection of                            specimen(s) by brushing or washing, when performed                            (separate procedure) Diagnosis Code(s):        --- Professional ---                           R10.13, Epigastric pain                           R68.81, Early satiety CPT copyright 2022 [employer]. All rights reserved. The codes documented in this report are preliminary and upon coder review may  be revised to meet current compliance requirements. Gerrit Friends. Jena Gauss, MD Rosita Fire  Rainbow Salman, MD 08/02/2023 12:38:42 PM This report has been signed electronically. Number of Addenda: 0

## 2023-08-02 NOTE — Interval H&P Note (Signed)
History and Physical Interval Note:  08/02/2023 12:09 PM  Cheryl Gutierrez  has presented today for surgery, with the diagnosis of epigastric pain,early satiety.  The various methods of treatment have been discussed with the patient and family. After consideration of risks, benefits and other options for treatment, the patient has consented to  Procedure(s) with comments: ESOPHAGOGASTRODUODENOSCOPY (EGD) WITH PROPOFOL (N/A) - 11:45 am, asa 2 as a surgical intervention.  The patient's history has been reviewed, patient examined, no change in status, stable for surgery.  I have reviewed the patient's chart and labs.  Questions were answered to the patient's satisfaction.     Cheryl Gutierrez    No change.  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    CT scan remains pending.    Denies dysphagia.  Diagnostic EGD today per plan.  The risks, benefits, limitations, alternatives and imponderables have been reviewed with the patient. Potential for esophageal dilation, biopsy, etc. have also been reviewed.  Questions have been answered. All parties agreeable.

## 2023-08-02 NOTE — Telephone Encounter (Signed)
Spoke to [place] at [facility] and she is having it read.

## 2023-08-03 LAB — SURGICAL PATHOLOGY

## 2023-08-03 NOTE — Anesthesia Postprocedure Evaluation (Signed)
Anesthesia Post Note  Patient: Cheryl Gutierrez  Procedure(s) Performed: ESOPHAGOGASTRODUODENOSCOPY (EGD) WITH PROPOFOL BIOPSY POLYPECTOMY  Patient location during evaluation: Phase II Anesthesia Type: General Level of consciousness: awake Pain management: pain level controlled Vital Signs Assessment: post-procedure vital signs reviewed and stable Respiratory status: spontaneous breathing and respiratory function stable Cardiovascular status: blood pressure returned to baseline and stable Postop Assessment: no headache and no apparent nausea or vomiting Anesthetic complications: no Comments: Late entry   No notable events documented.   Last Vitals:  Vitals:   08/02/23 1024 08/02/23 1236  BP: 137/70 (!) 108/53  Pulse: 69 73  Resp: 16 20  Temp: 36.5 C 36.4 C  SpO2: 96% 94%    Last Pain:  Vitals:   08/02/23 1236  TempSrc: Oral  PainSc: 0-No pain                 Windell Norfolk

## 2023-08-23 ENCOUNTER — Encounter (HOSPITAL_COMMUNITY): Payer: Self-pay | Admitting: Internal Medicine

## 2023-09-11 NOTE — Progress Notes (Unsigned)
GI Office Note    Referring Provider: Ignatius Specking, MD Primary Care Physician:  Ignatius Specking, MD  Primary Gastroenterologist: Roetta Sessions, MD   Chief Complaint   No chief complaint on file.   History of Present Illness   Cheryl Gutierrez is a 68 y.o. female presenting today for follow-up.  Last seen in November 2024 for abdominal pain.  Also with 1 year history of worsening early satiety, postprandial epigastric pain.  Since her last office visit she completed labs including TTG IgA less than 2, IgA low at 70, sed rate 5, CRP 1, TSH 1.620, IgG 920, TTG IgG less than 2.  EGD August 02, 2023: Normal esophagus.  Gastric polyps, gastric erythema status post biopsy.  Normal duodenal bulb and second portion of the duodenum.  Status post duodenal biopsy.  Gastric biopsies showed no evidence of cancer or infection.  Gastric polyp benign.  CT abdomen pelvis with contrast completed 07/27/2023: Ordered by PCP showed 4 mm nonobstructing left lower pole renal stone.  To explain patient's abdominal pain.      Prior data:  Labs 06/2023: WBC 8.2, Hgb 14.5, Platelets 351, creatinine 0.86, BUN 15, sodium 140, Potassium 3.9, albumin 4.4, Tbili 0.4, AP 85, AST 22, ALT 19,    Cologuard negative 01/2022. She reports that she would never do another colonoscopy again due to bowel prep.     EGD 03/2013: -erosive reflux esophagitis -hiatal hernia -gastric erosions, gastritis no h.pylori -gastric polyp, fundic gland   Colonoscopy 2008: -hyperplastic polyp removed      Medications   Current Outpatient Medications  Medication Sig Dispense Refill   alendronate (FOSAMAX) 70 MG tablet PLEASE SEE ATTACHED FOR DETAILED DIRECTIONS     atorvastatin (LIPITOR) 20 MG tablet Take 20 mg by mouth daily.  4   cholecalciferol (VITAMIN D) 1000 UNITS tablet Take 1,000 Units by mouth daily. Reported on 11/16/2015     pantoprazole (PROTONIX) 40 MG tablet Take 40 mg by mouth every morning.      PARoxetine (PAXIL) 40 MG tablet Take 40 mg by mouth every morning.       No current facility-administered medications for this visit.    Allergies   Allergies as of 09/12/2023   (No Known Allergies)     Past Medical History   Past Medical History:  Diagnosis Date   Diabetes mellitus without complication (HCC)    Controlled with diet   Hiatal hernia    Hyperplastic colon polyp    Reflux esophagitis    erosive    Past Surgical History   Past Surgical History:  Procedure Laterality Date   ABDOMINAL HYSTERECTOMY     BIOPSY  08/02/2023   Procedure: BIOPSY;  Surgeon: Corbin Ade, MD;  Location: [facility];  Service: Endoscopy;;   COLONOSCOPY  07/29/2007   Dr. Jena Gauss- anal papillae, o/w normal rectum, pedunculated polyp,mic sigmoid colon, hyperplastic polyp on bx, the remainder of the colonic mucosa appeared normal   ESOPHAGOGASTRODUODENOSCOPY N/A 03/31/2013   Dr. Darnelle Going reflux esophagitis, hiatal hernia, gastric erosions, gastric polyp.bx= mild chronic inactive gastritis, fundic gland polyp   ESOPHAGOGASTRODUODENOSCOPY (EGD) WITH PROPOFOL N/A 08/02/2023   Procedure: ESOPHAGOGASTRODUODENOSCOPY (EGD) WITH PROPOFOL;  Surgeon: Corbin Ade, MD;  Location: [facility];  Service: Endoscopy;  Laterality: N/A;  11:45 am, asa 2   POLYPECTOMY  08/02/2023   Procedure: POLYPECTOMY;  Surgeon: Corbin Ade, MD;  Location: [facility];  Service: Endoscopy;;   SHOULDER SURGERY  2019    Past Family History   Family History  Problem Relation Age of Onset   Depression Mother    Anesthesia problems Neg Hx    Broken bones Neg Hx    Cancer Neg Hx    Clotting disorder Neg Hx    Collagen disease Neg Hx    Diabetes Neg Hx    Dislocations Neg Hx    Osteoporosis Neg Hx    Rheumatologic disease Neg Hx    Scoliosis Neg Hx    Severe sprains Neg Hx    Colon cancer Neg Hx    Celiac disease Neg Hx    Inflammatory bowel disease Neg Hx     Past Social History   Social  History   Socioeconomic History   Marital status: Married    Spouse name: Not on file   Number of children: Not on file   Years of education: college   Highest education level: Not on file  Occupational History   Occupation: clerical  Tobacco Use   Smoking status: Never   Smokeless tobacco: Never  Vaping Use   Vaping status: Never Used  Substance and Sexual Activity   Alcohol use: No   Drug use: No   Sexual activity: Not on file  Other Topics Concern   Not on file  Social History Narrative   Not on file   Social Drivers of Health   Financial Resource Strain: Not on file  Food Insecurity: Not on file  Transportation Needs: Not on file  Physical Activity: Not on file  Stress: Not on file  Social Connections: Not on file  Intimate Partner Violence: Not on file    Review of Systems   General: Negative for anorexia, weight loss, fever, chills, fatigue, weakness. ENT: Negative for hoarseness, difficulty swallowing , nasal congestion. CV: Negative for chest pain, angina, palpitations, dyspnea on exertion, peripheral edema.  Respiratory: Negative for dyspnea at rest, dyspnea on exertion, cough, sputum, wheezing.  GI: See history of present illness. GU:  Negative for dysuria, hematuria, urinary incontinence, urinary frequency, nocturnal urination.  Endo: Negative for unusual weight change.     Physical Exam   There were no vitals taken for this visit.   General: Well-nourished, well-developed in no acute distress.  Eyes: No icterus. Mouth: Oropharyngeal mucosa moist and pink , no lesions erythema or exudate. Lungs: Clear to auscultation bilaterally.  Heart: Regular rate and rhythm, no murmurs rubs or gallops.  Abdomen: Bowel sounds are normal, nontender, nondistended, no hepatosplenomegaly or masses,  no abdominal bruits or hernia , no rebound or guarding.  Rectal: ***  Extremities: No lower extremity edema. No clubbing or deformities. Neuro: Alert and oriented x 4    Skin: Warm and dry, no jaundice.   Psych: Alert and cooperative, normal mood and affect.  Labs   *** Imaging Studies   No results found.  Assessment       PLAN   ***   Leanna Battles. Melvyn Neth, MHS, PA-C [facility]

## 2023-09-12 ENCOUNTER — Ambulatory Visit (INDEPENDENT_AMBULATORY_CARE_PROVIDER_SITE_OTHER): Payer: Medicare Other | Admitting: Gastroenterology

## 2023-09-12 ENCOUNTER — Encounter: Payer: Self-pay | Admitting: Gastroenterology

## 2023-09-12 VITALS — BP 107/71 | HR 112 | Temp 98.9°F | Ht 64.0 in | Wt 160.4 lb

## 2023-09-12 DIAGNOSIS — R194 Change in bowel habit: Secondary | ICD-10-CM

## 2023-09-12 DIAGNOSIS — R1013 Epigastric pain: Secondary | ICD-10-CM

## 2023-09-12 DIAGNOSIS — R6881 Early satiety: Secondary | ICD-10-CM | POA: Diagnosis not present

## 2023-09-12 DIAGNOSIS — R1010 Upper abdominal pain, unspecified: Secondary | ICD-10-CM

## 2023-09-12 NOTE — Patient Instructions (Signed)
Take your pantoprazole 40mg  every morning before breakfast for at least the next 12 weeks. At that point if you are doing well you can wean off over a couple of weeks by taking every 2-3 days. If you continue to have ongoing episodes of abdominal pain, feeling full, etc, then please let me know. Monitor your bowel function. If your stools don't improve with increased food consumption, please let us know as you may need to consider updating your colonoscopy.

## 2023-11-06 ENCOUNTER — Ambulatory Visit (INDEPENDENT_AMBULATORY_CARE_PROVIDER_SITE_OTHER): Admitting: Orthopaedic Surgery

## 2023-11-06 ENCOUNTER — Other Ambulatory Visit (INDEPENDENT_AMBULATORY_CARE_PROVIDER_SITE_OTHER): Payer: Self-pay

## 2023-11-06 DIAGNOSIS — M25512 Pain in left shoulder: Secondary | ICD-10-CM

## 2023-11-06 NOTE — Progress Notes (Unsigned)
 Office Visit Note   Patient: Cheryl Gutierrez           Date of Birth: Jun 13, 1956           MRN: [id number] Visit Date: 11/06/2023              Requested by: Ignatius Specking, MD [address] [place],  [place] [address] PCP: Ignatius Specking, MD   Assessment & Plan: Visit Diagnoses:  1. Acute pain of left shoulder     Plan: Patient with acute fall blackout while doing yard work.  Unknown exactly what happened in her shoulder.  Shoulder is reduced there is no fracture on x-ray.  Will have her use a sling she can do some gentle pendulum's gradually begin range of motion.  She is not making progress and would like some therapy she will call let us know.  She has persistent symptoms after few weeks and diagnostic imaging would be appropriate.  Follow-Up Instructions: No follow-ups on file.   Orders:  Orders Placed This Encounter  Procedures   XR Shoulder Left   No orders of the defined types were placed in this encounter.     Procedures: No procedures performed   Clinical Data: No additional findings.   Subjective: Chief Complaint  Patient presents with   Left Shoulder - Pain    HPI 68 year old female with returns with injury to her left shoulder when she was out in the yard leaning forward.  She states her PCP states she has some postural hypotension leaning forward she gets dizzy apparently fell injuring her tailbone also had injury to her left arm.  Three-view x-rays left shoulder obtained today.  She states she has had trouble moving her arm it is painful no numbness or tingling in her fingers no associated neck pain.  No history of shoulder dislocation.  Review of Systems all other systems noncontributory to HPI.   Objective: Vital Signs: There were no vitals taken for this visit.  Physical Exam Constitutional:      Appearance: She is well-developed.  HENT:     Head: Normocephalic.     Right Ear: External ear normal.     Left Ear: External ear normal. There is no  impacted cerumen.  Eyes:     Pupils: Pupils are equal, round, and reactive to light.  Neck:     Thyroid: No thyromegaly.     Trachea: No tracheal deviation.  Cardiovascular:     Rate and Rhythm: Normal rate.  Pulmonary:     Effort: Pulmonary effort is normal.  Abdominal:     Palpations: Abdomen is soft.  Musculoskeletal:     Cervical back: No rigidity.  Skin:    General: Skin is warm and dry.  Neurological:     Mental Status: She is alert and oriented to person, place, and time.  Psychiatric:        Behavior: Behavior normal.     Ortho Exam patient has 10 to 15 degrees active flexion.  Sensation the hand is intact.  Tenderness about the shoulder.  Specialty Comments:  No specialty comments available.  Imaging: No results found.   PMFS History: Patient Active Problem List   Diagnosis Date Noted   Abdominal pain, epigastric 07/09/2023   Early satiety 07/09/2023   Change in bowel function 07/09/2023   Trochanteric bursitis, left hip 12/29/2021   Pain in right knee 10/06/2021   Tendinitis, de Quervain's 09/08/2021   Biceps tendinosis of left shoulder 09/26/2017  Arthralgia of acromioclavicular joint 05/25/2014   GERD (gastroesophageal reflux disease) 05/08/2013   Elevated liver enzymes 05/08/2013   Abdominal pain 03/25/2013   Rotator cuff syndrome of left shoulder 07/31/2011   Past Medical History:  Diagnosis Date   Diabetes mellitus without complication (HCC)    Controlled with diet   Hiatal hernia    Hyperplastic colon polyp    Reflux esophagitis    erosive    Family History  Problem Relation Age of Onset   Depression Mother    Anesthesia problems Neg Hx    Broken bones Neg Hx    Cancer Neg Hx    Clotting disorder Neg Hx    Collagen disease Neg Hx    Diabetes Neg Hx    Dislocations Neg Hx    Osteoporosis Neg Hx    Rheumatologic disease Neg Hx    Scoliosis Neg Hx    Severe sprains Neg Hx    Colon cancer Neg Hx    Celiac disease Neg Hx     Inflammatory bowel disease Neg Hx     Past Surgical History:  Procedure Laterality Date   ABDOMINAL HYSTERECTOMY     BIOPSY  08/02/2023   Procedure: BIOPSY;  Surgeon: Corbin Ade, MD;  [facility]: [facility];  Service: Endoscopy;;   COLONOSCOPY  07/29/2007   Dr. Jena Gauss- anal papillae, o/w normal rectum, pedunculated polyp,mic sigmoid colon, hyperplastic polyp on bx, the remainder of the colonic mucosa appeared normal   ESOPHAGOGASTRODUODENOSCOPY N/A 03/31/2013   Dr. Darnelle Going reflux esophagitis, hiatal hernia, gastric erosions, gastric polyp.bx= mild chronic inactive gastritis, fundic gland polyp   ESOPHAGOGASTRODUODENOSCOPY (EGD) WITH PROPOFOL N/A 08/02/2023   Procedure: ESOPHAGOGASTRODUODENOSCOPY (EGD) WITH PROPOFOL;  Surgeon: Corbin Ade, MD;  [facility]: [facility];  Service: Endoscopy;  Laterality: N/A;  11:45 am, asa 2   POLYPECTOMY  08/02/2023   Procedure: POLYPECTOMY;  Surgeon: Corbin Ade, MD;  Location: [facility];  Service: Endoscopy;;   SHOULDER SURGERY  2019   Social History   Occupational History   Occupation: clerical  Tobacco Use   Smoking status: Never   Smokeless tobacco: Never  Vaping Use   Vaping status: Never Used  Substance and Sexual Activity   Alcohol use: No   Drug use: No   Sexual activity: Not on file

## 2023-12-01 ENCOUNTER — Encounter (HOSPITAL_COMMUNITY): Payer: Self-pay | Admitting: Emergency Medicine

## 2023-12-01 ENCOUNTER — Emergency Department (HOSPITAL_COMMUNITY)
Admission: EM | Admit: 2023-12-01 | Discharge: 2023-12-01 | Disposition: A | Discharge status: Home / Self Care | Attending: Emergency Medicine | Admitting: Emergency Medicine

## 2023-12-01 ENCOUNTER — Other Ambulatory Visit: Payer: Self-pay

## 2023-12-01 DIAGNOSIS — W57XXXA Bitten or stung by nonvenomous insect and other nonvenomous arthropods, initial encounter: Secondary | ICD-10-CM | POA: Insufficient documentation

## 2023-12-01 DIAGNOSIS — S40861A Insect bite (nonvenomous) of right upper arm, initial encounter: Secondary | ICD-10-CM | POA: Diagnosis present

## 2023-12-01 NOTE — Discharge Instructions (Signed)
 It was a pleasure taking care of you today.  You were seen for a tick bite with part of the head still embedded in your arm.  I was able to remove a small portion of this but not all of it.  You can keep the area clean and dry, use antibiotic ointment topical daily until this heals.  We do not need to do anything further to have this removed.

## 2023-12-01 NOTE — ED Provider Notes (Signed)
 [place] EMERGENCY DEPARTMENT AT [facility] Provider Note   CSN: [id number] Arrival date & time: 12/01/23  2036     History  Chief Complaint  Patient presents with   Tick Removal    Cheryl Gutierrez is a 68 y.o. female.  She has history of GERD, presents the ER with complaint of tick bite, noticed in the shower today it was not engorged she pulled the tick off but states in the head is still embedded and she was worried so came to the ER.  Denies pain fever or other complaints.  HPI     Home Medications Prior to Admission medications   Medication Sig Start Date End Date Taking? Authorizing Provider  alendronate (FOSAMAX) 70 MG tablet PLEASE SEE ATTACHED FOR DETAILED DIRECTIONS 05/02/23   [provider]  atorvastatin (LIPITOR) 20 MG tablet Take 20 mg by mouth daily. 05/17/17   [provider]  cholecalciferol (VITAMIN D) 1000 UNITS tablet Take 1,000 Units by mouth daily. Reported on 11/16/2015    [provider]  pantoprazole (PROTONIX) 40 MG tablet Take 40 mg by mouth every morning. 06/28/21   [provider]  PARoxetine (PAXIL) 40 MG tablet Take 40 mg by mouth every morning.      [provider]  Vitamin D, Ergocalciferol, (DRISDOL) 1.25 MG (50000 UNIT) CAPS capsule Take 50,000 Units by mouth once a week. 07/10/23   [provider]      Allergies    Patient has no known allergies.    Review of Systems   Review of Systems  Physical Exam Updated Vital Signs BP (!) 140/64 (BP Location: Right Arm)   Pulse 85   Temp 97.8 F (36.6 C) (Oral)   Resp 16   Ht 5\' 4"  (1.626 m)   Wt 72.8 kg   SpO2 95%   BMI 27.55 kg/m  Physical Exam Vitals and nursing note reviewed.  Constitutional:      General: She is not in acute distress.    Appearance: She is well-developed.  HENT:     Head: Normocephalic and atraumatic.  Eyes:     Conjunctiva/sclera: Conjunctivae normal.  Cardiovascular:     Rate and Rhythm: Normal  rate and regular rhythm.     Heart sounds: No murmur heard. Pulmonary:     Effort: Pulmonary effort is normal. No respiratory distress.     Breath sounds: Normal breath sounds.  Abdominal:     Palpations: Abdomen is soft.     Tenderness: There is no abdominal tenderness.  Musculoskeletal:        General: No swelling.     Cervical back: Neck supple.  Skin:    General: Skin is warm and dry.     Capillary Refill: Capillary refill takes less than 2 seconds.     Comments: Right axilla has excoriations, noted to be where patient tried to remove tick head with help of her husband, small punctate black amount of foreign material  Neurological:     General: No focal deficit present.     Mental Status: She is alert and oriented to person, place, and time.  Psychiatric:        Mood and Affect: Mood normal.     ED Results / Procedures / Treatments   Labs (all labs ordered are listed, but only abnormal results are displayed) Labs Reviewed - No data to display  EKG None  Radiology No results found.  Procedures Procedures    Medications Ordered in ED  Medications - No data to display  ED Course/ Medical Decision Making/ A&P                                 Medical Decision Making Dermatitis includes but not limited to insect bite, retained foreign material, other  ED course: Patient had tick bite, attached for short period, tick was not engorged, patient removed the body but head was still embedded, I cleaned the area with alcohol and use forceps, was able to remove a small amount of foreign material felt to be a part of the tick head but not able to remove the entire amount, to the patient and no further treatment needed, keep the area clean and dry, use antibiotic ointment.  Take was only attached for very short period, do not feel this is requiring empiric treatment for Lyme, very low risk for tickborne illness transmission  Patient advised on follow-up and return  precautions.           Final Clinical Impression(s) / ED Diagnoses Final diagnoses:  Tick bite of right upper arm, initial encounter    Rx / DC Orders ED Discharge Orders     None         Joshua Nieves 12/01/23 2145    Early Glisson, MD 12/02/23 1213

## 2023-12-01 NOTE — ED Triage Notes (Signed)
 Pt presents to ED c/o needing the remaining part of a tick removed from her right armpit. Pt states she was able to remove most of it but unable to remove the head.

## 2024-02-29 ENCOUNTER — Encounter: Payer: Self-pay | Admitting: Advanced Practice Midwife

## 2024-06-16 ENCOUNTER — Encounter: Payer: Self-pay | Admitting: Radiology
